# Patient Record
Sex: Female | Born: 1990 | Race: Black or African American | Hispanic: No | Marital: Single | Smoking: Never smoker
Health system: Southern US, Community
[De-identification: ages and names within clinical notes are randomized; demographics above are authoritative.]

## PROBLEM LIST (undated history)

## (undated) DIAGNOSIS — N390 Urinary tract infection, site not specified: Secondary | ICD-10-CM

## (undated) DIAGNOSIS — E209 Hypoparathyroidism, unspecified: Secondary | ICD-10-CM

---

## 2017-09-19 ENCOUNTER — Encounter (HOSPITAL_COMMUNITY): Payer: Self-pay | Admitting: *Deleted

## 2017-09-19 ENCOUNTER — Other Ambulatory Visit: Payer: Self-pay

## 2017-09-19 ENCOUNTER — Emergency Department (HOSPITAL_COMMUNITY)
Admission: EM | Admit: 2017-09-19 | Discharge: 2017-09-19 | Disposition: A | Discharge status: Home / Self Care | Payer: Self-pay | Attending: Emergency Medicine | Admitting: Emergency Medicine

## 2017-09-19 DIAGNOSIS — K0889 Other specified disorders of teeth and supporting structures: Secondary | ICD-10-CM | POA: Insufficient documentation

## 2017-09-19 MED ORDER — PENICILLIN V POTASSIUM 500 MG PO TABS: 500.0000 mg | ORAL_TABLET | Freq: Four times a day (QID) | ORAL | 0 refills | Status: AC

## 2017-09-19 MED ORDER — NAPROXEN 500 MG PO TABS: 500.0000 mg | ORAL_TABLET | Freq: Two times a day (BID) | ORAL | 0 refills | Status: DC

## 2017-09-19 MED ORDER — LIDOCAINE VISCOUS HCL 2 % MT SOLN: 15.0000 mL | OROMUCOSAL | 0 refills | Status: DC | PRN

## 2017-09-19 NOTE — ED Triage Notes (Signed)
Pt reports right upper dental pain for several days. Airway intact.

## 2017-09-19 NOTE — Discharge Instructions (Signed)
Please read and follow all provided instructions.  Your diagnoses today include:  1. Pain, dental     The exam and treatment you received today has been provided on an emergency basis only. This is not a substitute for complete medical or dental care. This problem will not resolve on its own without the care of a dentist.  Tests performed today include: Vital signs. See below for your results today.   Medications prescribed:   Take any prescribed medications only as directed. Use ibuprofen or naproxen for pain. Use the viscous lidocaine for mouth pain. Swish with the lidocaine and spit it out. Do not swallow it.  Please take all of your antibiotics until finished!   You may develop abdominal discomfort or diarrhea from the antibiotic.  You may help offset this with probiotics which you can buy or get in yogurt. Do not eat or take the probiotics until 2 hours after your antibiotic. Do not take your medicine if develop an itchy rash, swelling in your mouth or lips, or difficulty breathing.   Home care instructions:  Follow any educational materials contained in this packet.  Follow-up instructions: Please follow-up with your dentist for further evaluation of your symptoms.   Dental Assistance: See handout for dental referrals  Return instructions:  Please return to the Emergency Department if you experience worsening symptoms. Please return if you develop a fever, you develop more swelling in your face or neck, you have trouble breathing or swallowing food. Please return if you have any other emergent concerns.  Additional Information:  Your vital signs today were: BP 111/76 (BP Location: Right Arm)    Pulse 61    Temp 98.5 F (36.9 C) (Oral)    Resp 20    LMP 09/11/2017    SpO2 100%  If your blood pressure (BP) was elevated above 135/85 this visit, please have this repeated by your doctor within one month. --------------

## 2017-09-19 NOTE — ED Provider Notes (Signed)
[facility] EMERGENCY DEPARTMENT Provider Note   CSN: [id number] Arrival date & time: 09/19/17  1438     History   Chief Complaint Chief Complaint  Patient presents with  . Dental Pain    HPI Mary Monroe is a 27 y.o. female with no significant past medical history presents emergency department today for right upper dental pain.  Patient reports that she has been dealing with right upper dental pain for the last several months.  She initially had a filling placed but this fell out when she was chewing gum.  She reports that she recently moved down from [place] and does not have a dentist.  She is requesting resources so she can find one.  She reports over the last 3 days she has been having increasing pain in her right upper tooth.  She states this is worse with cold foods as well as chewing.  She is taken ibuprofen for symptoms with mild relief. Denies fever, chills, voice change, inability to control secretions, nausea/vomiting, facial swelling, dysphagia, odynophagia, drainage or trauma  HPI  History reviewed. No pertinent past medical history.  There are no active problems to display for this patient.   History reviewed. No pertinent surgical history.   OB History   None      Home Medications    Prior to Admission medications   Not on File    Family History History reviewed. No pertinent family history.  Social History Social History   Tobacco Use  . Smoking status: Never Smoker  Substance Use Topics  . Alcohol use: Yes    Comment: socially  . Drug use: Never     Allergies   Patient has no known allergies.   Review of Systems Review of Systems  All other systems reviewed and are negative.    Physical Exam Updated Vital Signs BP 111/76 (BP Location: Right Arm)   Pulse 61   Temp 98.5 F (36.9 C) (Oral)   Resp 20   LMP 09/11/2017   SpO2 100%   Physical Exam  Constitutional: She appears well-developed and well-nourished.    HENT:  Head: Normocephalic and atraumatic.  Right Ear: External ear normal.  Left Ear: External ear normal.  Nose: Nose normal.  Mouth/Throat: Uvula is midline, oropharynx is clear and moist and mucous membranes are normal. No tonsillar exudate.    The patient has normal phonation and is in control of secretions. No stridor.  Midline uvula without edema. Soft palate rises symmetrically. No tonsillar erythema or exudates. No PTA. Tongue protrusion is normal. No trismus. No creptius on neck palpation. There is noted cavity of tooth indicated in diagram. No gingival erythema or fluctuance noted. Mucus membranes moist.   Eyes: Pupils are equal, round, and reactive to light. Right Monroe exhibits no discharge. Left Monroe exhibits no discharge. No scleral icterus.  Neck: Trachea normal. Neck supple. No spinous process tenderness present. No neck rigidity. Normal range of motion present.  No nuchal rigidity or meningismus.  No submental or submandibular edema.  No crepitus on neck palpation.   Cardiovascular: Normal rate, regular rhythm and intact distal pulses.  No murmur heard. Pulses:      Radial pulses are 2+ on the right side, and 2+ on the left side.       Dorsalis pedis pulses are 2+ on the right side, and 2+ on the left side.       Posterior tibial pulses are 2+ on the right side, and 2+ on  the left side.  Pulmonary/Chest: Effort normal and breath sounds normal. She exhibits no tenderness.  Abdominal: Soft. Bowel sounds are normal. There is no tenderness. There is no rebound and no guarding.  Musculoskeletal: She exhibits no edema.  Lymphadenopathy:    She has no cervical adenopathy.  Neurological: She is alert.  Skin: Skin is warm and dry. No rash noted. She is not diaphoretic.  Psychiatric: She has a normal mood and affect.  Nursing note and vitals reviewed.    ED Treatments / Results  Labs (all labs ordered are listed, but only abnormal results are displayed) Labs Reviewed - No data  to display  EKG None  Radiology No results found.  Procedures Procedures (including critical care time)  Medications Ordered in ED Medications - No data to display   Initial Impression / Assessment and Plan / ED Course  I have reviewed the triage vital signs and the nursing notes.  Pertinent labs & imaging results that were available during my care of the patient were reviewed by me and considered in my medical decision making (see chart for details).     Patient with toothache.  No gross abscess.  Exam unconcerning for Ludwig's angina or spread of infection.  Will treat with penicillin and anti-inflammatories medicine.  Urged patient to follow-up with dentist. Return precautions discussed.   Final Clinical Impressions(s) / ED Diagnoses   Final diagnoses:  Pain, dental    ED Discharge Orders         Ordered    penicillin v potassium (VEETID) 500 MG tablet  4 times daily     09/19/17 1830    lidocaine (XYLOCAINE) 2 % solution  As needed     09/19/17 1830    naproxen (NAPROSYN) 500 MG tablet  2 times daily     09/19/17 1830           Princella Pellegrini 09/19/17 1830    Sabas Sous, MD 09/20/17 [phone]

## 2018-05-31 DIAGNOSIS — E559 Vitamin D deficiency, unspecified: Secondary | ICD-10-CM | POA: Insufficient documentation

## 2019-05-02 ENCOUNTER — Emergency Department (HOSPITAL_COMMUNITY): Payer: 59

## 2019-05-02 ENCOUNTER — Emergency Department (HOSPITAL_COMMUNITY)
Admission: EM | Admit: 2019-05-02 | Discharge: 2019-05-03 | Disposition: A | Discharge status: Home / Self Care | Payer: 59 | Attending: Emergency Medicine | Admitting: Emergency Medicine

## 2019-05-02 ENCOUNTER — Encounter (HOSPITAL_COMMUNITY): Payer: Self-pay | Admitting: *Deleted

## 2019-05-02 ENCOUNTER — Other Ambulatory Visit: Payer: Self-pay

## 2019-05-02 DIAGNOSIS — R102 Pelvic and perineal pain: Secondary | ICD-10-CM | POA: Insufficient documentation

## 2019-05-02 DIAGNOSIS — M545 Low back pain: Secondary | ICD-10-CM | POA: Diagnosis not present

## 2019-05-02 DIAGNOSIS — N938 Other specified abnormal uterine and vaginal bleeding: Secondary | ICD-10-CM

## 2019-05-02 DIAGNOSIS — N939 Abnormal uterine and vaginal bleeding, unspecified: Secondary | ICD-10-CM | POA: Diagnosis present

## 2019-05-02 LAB — CBC WITH DIFFERENTIAL/PLATELET
Abs Immature Granulocytes: 0.02 10*3/uL (ref 0.00–0.07)
Basophils Absolute: 0.1 10*3/uL (ref 0.0–0.1)
Basophils Relative: 1 %
Eosinophils Absolute: 0.1 10*3/uL (ref 0.0–0.5)
Eosinophils Relative: 2 %
HCT: 41.5 % (ref 36.0–46.0)
Hemoglobin: 13.1 g/dL (ref 12.0–15.0)
Immature Granulocytes: 0 %
Lymphocytes Relative: 43 %
Lymphs Abs: 3.2 10*3/uL (ref 0.7–4.0)
MCH: 26.6 pg (ref 26.0–34.0)
MCHC: 31.6 g/dL (ref 30.0–36.0)
MCV: 84.2 fL (ref 80.0–100.0)
Monocytes Absolute: 0.8 10*3/uL (ref 0.1–1.0)
Monocytes Relative: 11 %
Neutro Abs: 3.2 10*3/uL (ref 1.7–7.7)
Neutrophils Relative %: 43 %
Platelets: 308 10*3/uL (ref 150–400)
RBC: 4.93 MIL/uL (ref 3.87–5.11)
RDW: 15 % (ref 11.5–15.5)
WBC: 7.5 10*3/uL (ref 4.0–10.5)
nRBC: 0 % (ref 0.0–0.2)

## 2019-05-02 LAB — WET PREP, GENITAL
Sperm: NONE SEEN
Trich, Wet Prep: NONE SEEN
WBC, Wet Prep HPF POC: NONE SEEN
Yeast Wet Prep HPF POC: NONE SEEN

## 2019-05-02 LAB — BASIC METABOLIC PANEL
Anion gap: 9 (ref 5–15)
BUN: 9 mg/dL (ref 6–20)
CO2: 25 mmol/L (ref 22–32)
Calcium: 10.5 mg/dL — ABNORMAL HIGH (ref 8.9–10.3)
Chloride: 100 mmol/L (ref 98–111)
Creatinine, Ser: 0.82 mg/dL (ref 0.44–1.00)
GFR calc Af Amer: >60 mL/min (ref >60)
GFR calc non Af Amer: >60 mL/min (ref >60)
Glucose, Bld: 93 mg/dL (ref 70–99)
Potassium: 3.4 mmol/L — ABNORMAL LOW (ref 3.5–5.1)
Sodium: 134 mmol/L — ABNORMAL LOW (ref 135–145)

## 2019-05-02 LAB — I-STAT BETA HCG BLOOD, ED (MC, WL, AP ONLY): I-stat hCG, quantitative: <5 m[IU]/mL (ref <5)

## 2019-05-02 MED ORDER — ACETAMINOPHEN 500 MG PO TABS
1000.0000 mg | ORAL_TABLET | Freq: Once | ORAL | Status: AC
  Administered 2019-05-02: 23:00:00 1000 mg via ORAL
  Filled 2019-05-02: qty 2

## 2019-05-02 MED ORDER — ACETAMINOPHEN 325 MG PO TABS
650.0000 mg | ORAL_TABLET | Freq: Once | ORAL | Status: DC
  Filled 2019-05-02: qty 2

## 2019-05-02 NOTE — ED Notes (Signed)
Pt transported to US at this time. 

## 2019-05-02 NOTE — ED Triage Notes (Signed)
Pt says that she has had vaginal spotting for about 3 weeks, yesterday started having heavier bleeding with clots associated with abdominal cramping. Reports negative pregnancy test.

## 2019-05-02 NOTE — ED Provider Notes (Signed)
[facility] EMERGENCY DEPARTMENT Provider Note   CSN: [id number] Arrival date & time: 05/02/19  2021     History Chief Complaint  Patient presents with  . Vaginal Discharge    Mary Monroe is a 29 y.o. female G1P1 with no pertinent past medical history that presents to the emergency department today for vaginal bleeding.  She denies chance of pregnancy.  She states that her LMP was March 26 and ended March 30.  She has had vaginal spotting for the last two weeks, she states that she presented to the emergency department today because it was the worst today.   She states that she has been going through about 5 pads daily.  She states that this has never happened before, her periods have been normal without bleeding in between periods.  She states that she had her last child about 2 years ago without any major complications. She denies taking any oral contraceptives or estrogen hormone therapy.  She denies starting any other new medications.  She denies any chances of pregnancies or STDs.  She has 1 sexual partner. She also admits to bilateral lower quadrant abdominal/pelvic pain and back pain that started around the same time as her spotting.  She describes the abdominal pain as cramping and it is constant, she states that it is a 7/10.  She states that her back pain is in her lower back which just started this week.  She states that when she normally gets her periods she has cramps which feels similar, however this pain today is worse.  She has not taken anything for pain.  She denies any chest pain, shortness of breath, dizziness, headache, paresthesias, fatigue, weakness, trouble with gait, dysuria, nausea, vomiting, diarrhea, hematochezia, fevers, chills.  She states that she has not seen her OB/GYN for 2 years, unknown last Pap.  She states that she could have had an abnormal Pap in the past but is unsure.  In regards to her GYN history she states that she does have history of  ovarian cysts which have gone away.  She has no personal or family history of endometriosis or PCOS. HPI     History reviewed. No pertinent past medical history.  There are no problems to display for this patient.   History reviewed. No pertinent surgical history.   OB History   No obstetric history on file.     No family history on file.  Social History   Tobacco Use  . Smoking status: Never Smoker  Substance Use Topics  . Alcohol use: Yes    Comment: socially  . Drug use: Never    Home Medications Prior to Admission medications   Medication Sig Start Date End Date Taking? Authorizing Provider  lidocaine (XYLOCAINE) 2 % solution Use as directed 15 mLs in the mouth or throat as needed for mouth pain. Patient not taking: Reported on 05/02/2019 09/19/17   Maczis, Elmer Sow, PA-C  naproxen (NAPROSYN) 500 MG tablet Take 1 tablet (500 mg total) by mouth 2 (two) times daily. Patient not taking: Reported on 05/02/2019 09/19/17   Jacinto Halim, PA-C    Allergies    Patient has no known allergies.  Review of Systems   Review of Systems  Constitutional: Negative for chills, diaphoresis, fatigue and fever.  HENT: Negative.   Eyes: Negative.   Respiratory: Negative for cough, chest tightness and shortness of breath.   Cardiovascular: Negative for chest pain and palpitations.  Gastrointestinal: Positive for abdominal pain. Negative  for blood in stool, nausea, rectal pain and vomiting.  Genitourinary: Positive for menstrual problem, pelvic pain, vaginal bleeding and vaginal pain. Negative for dysuria, flank pain, hematuria and vaginal discharge.  Skin: Negative for pallor.  Neurological: Negative for dizziness, weakness and numbness.  Psychiatric/Behavioral: Negative.     Physical Exam Updated Vital Signs BP 128/75 (BP Location: Right Arm)   Pulse 85   Temp 98.6 F (37 C) (Oral)   Resp 18   LMP 04/05/2019   SpO2 100%   Physical Exam .PE: Constitutional:  well-developed, well-nourished, no apparent distress HENT: normocephalic, atraumatic Cardiovascular: normal rate and rhythm, distal pulses intact Pulmonary/Chest: effort normal; breath sounds clear and equal bilaterally; no wheezes or rales Abdominal: soft, nondistended.  Slight tenderness to palpation of generalized lower abdomen to pelvis.  No guarding.  No rebound tenderness.  No Murphy sign.  No McBurney's point tenderness. Musculoskeletal: full ROM, no edema, patient is tender towards lower back, no midline tenderness.  Is able to move back in all directions without pain. GYN: Chaperone present,.Pelvic exam shows normal external genitalia without any lesions, dried blood around introitus.  Multiple blood clots noticed in vaginal canal, no abnormal discharge.  Cervix is nonfriable, closed.  There is moderate amount of blood coming from the cervix.  Patient is extremely tender to speculum.  Patient is extremely tender to bimanual exam in both adnexa.  No true cervical motion tenderness. Neurological: alert with goal directed thinking Skin: warm and dry, no rash, no diaphoresis Psychiatric: normal mood and affect, normal behavior   ED Results / Procedures / Treatments   Labs (all labs ordered are listed, but only abnormal results are displayed) Labs Reviewed  WET PREP, GENITAL  CBC WITH DIFFERENTIAL/PLATELET  BASIC METABOLIC PANEL  URINALYSIS, ROUTINE W REFLEX MICROSCOPIC  I-STAT BETA HCG BLOOD, ED ([facility], [facility], [facility] ONLY)  GC/CHLAMYDIA PROBE AMP ([place]) NOT AT [facility]    EKG None  Radiology No results found.  Procedures Procedures (including critical care time)  Medications Ordered in ED Medications  acetaminophen (TYLENOL) tablet 650 mg (has no administration in time range)    ED Course  I have reviewed the triage vital signs and the nursing notes.  Pertinent labs & imaging results that were available during my care of the patient were reviewed by me and considered in my  medical decision making (see chart for details).    MDM Rules/Calculators/A&P    Keith Cancio is a 29 y.o. female G1P1 with no pertinent past medical history that presents to the emergency department today for vaginal bleeding.  Patient is not pregnant.  Patient is extremely tender on pelvic exam.Differential to include uterine fibroid, ovarian cyst rupture, ectopic pregnancy, TOA, endometriosis, adenomyosis.  Unlikely to be ovarian rupture due to prolonged time.  I ordered, reviewed and interpreted labs which included stable CBC and CMP.  Wet prep showed clue cells, symptoms and pelvic exam not suggestive of BV.  Gonorrhea chlamydia collected.  Beta hCG negative. I ordered imaging studies to include pelvic ultrasound which has not been completed.   At shift change, care given to [employer] who will follow [place] and make disposition based on that. Pain controlled with Tylenol.   I discussed this case with my attending physician,Dr. Sabra Heck, who cosigned this note including patient's presenting symptoms, physical exam, and planned diagnostics and interventions. Attending physician stated agreement with plan or made changes to plan which were implemented.     Final Clinical Impression(s) / ED Diagnoses Final diagnoses:  None    Rx / DC Orders ED Discharge Orders    None       Farrel Gordon, Cordelia Poche 05/02/19 Ouida Sills    Eber Hong, MD 05/04/19 [phone]

## 2019-05-02 NOTE — ED Notes (Signed)
Pelvic cart is at bedside 

## 2019-05-03 LAB — URINALYSIS, ROUTINE W REFLEX MICROSCOPIC
Bilirubin Urine: NEGATIVE
Glucose, UA: NEGATIVE mg/dL
Ketones, ur: NEGATIVE mg/dL
Leukocytes,Ua: NEGATIVE
Nitrite: NEGATIVE
Protein, ur: 30 mg/dL — AB
RBC / HPF: >50 RBC/hpf — ABNORMAL HIGH (ref 0–5)
Specific Gravity, Urine: 1.02 (ref 1.005–1.030)
pH: 7 (ref 5.0–8.0)

## 2019-05-03 LAB — GC/CHLAMYDIA PROBE AMP (~~LOC~~) NOT AT ARMC
Chlamydia: NEGATIVE
Comment: NEGATIVE
Comment: NORMAL
Neisseria Gonorrhea: NEGATIVE

## 2019-05-03 NOTE — ED Notes (Signed)
Patient verbalizes understanding of discharge instructions. Opportunity for questioning and answers were provided. Armband removed by staff, pt discharged from ED ambulatory to home.  

## 2019-05-03 NOTE — Discharge Instructions (Addendum)
Please contact your OBGYN tomorrow.  You will need to make a follow-up appointment with them.  Tonight, your blood counts and ultrasound are reassuring.  Continue to drink plenty of fluids.  Return to the ER for fever, syncope, or for any other symptoms that concern you.

## 2019-05-03 NOTE — ED Provider Notes (Signed)
Patient signed out to me at shift change.  Here for vaginal bleeding x 3 weeks with some cramping.    HCG negative.  HGB stable, no anemia.  VSS.  Non-toxic appearing.  [place] of pelvis is normal.   Recommend outpatient f/u with OBGYN for abnormal uterine bleeding.  Patient understands and agrees with the plan.   Roxy Horseman, PA-C 05/03/19 0009    Eber Hong, MD 05/04/19 [phone]

## 2019-05-04 LAB — URINE CULTURE: Culture: NO GROWTH

## 2020-03-09 ENCOUNTER — Other Ambulatory Visit: Payer: Self-pay

## 2020-03-09 ENCOUNTER — Encounter (HOSPITAL_COMMUNITY): Payer: Self-pay | Admitting: Emergency Medicine

## 2020-03-09 ENCOUNTER — Emergency Department (HOSPITAL_COMMUNITY)
Admission: EM | Admit: 2020-03-09 | Discharge: 2020-03-09 | Disposition: A | Discharge status: Home / Self Care | Payer: 59 | Attending: Emergency Medicine | Admitting: Emergency Medicine

## 2020-03-09 DIAGNOSIS — R300 Dysuria: Secondary | ICD-10-CM | POA: Insufficient documentation

## 2020-03-09 DIAGNOSIS — R102 Pelvic and perineal pain: Secondary | ICD-10-CM | POA: Insufficient documentation

## 2020-03-09 DIAGNOSIS — Z793 Long term (current) use of hormonal contraceptives: Secondary | ICD-10-CM | POA: Insufficient documentation

## 2020-03-09 DIAGNOSIS — N898 Other specified noninflammatory disorders of vagina: Secondary | ICD-10-CM

## 2020-03-09 HISTORY — DX: Urinary tract infection, site not specified: N39.0

## 2020-03-09 LAB — WET PREP, GENITAL
Clue Cells Wet Prep HPF POC: NONE SEEN
Sperm: NONE SEEN
Trich, Wet Prep: NONE SEEN
Yeast Wet Prep HPF POC: NONE SEEN

## 2020-03-09 LAB — URINALYSIS, ROUTINE W REFLEX MICROSCOPIC
Bilirubin Urine: NEGATIVE
Glucose, UA: NEGATIVE mg/dL
Hgb urine dipstick: NEGATIVE
Ketones, ur: 20 mg/dL — AB
Leukocytes,Ua: NEGATIVE
Nitrite: NEGATIVE
Protein, ur: NEGATIVE mg/dL
Specific Gravity, Urine: 1.029 (ref 1.005–1.030)
pH: 5 (ref 5.0–8.0)

## 2020-03-09 LAB — CBC WITH DIFFERENTIAL/PLATELET
Abs Immature Granulocytes: 0 10*3/uL (ref 0.00–0.07)
Basophils Absolute: 0 10*3/uL (ref 0.0–0.1)
Basophils Relative: 1 %
Eosinophils Absolute: 0.1 10*3/uL (ref 0.0–0.5)
Eosinophils Relative: 1 %
HCT: 37.1 % (ref 36.0–46.0)
Hemoglobin: 11.8 g/dL — ABNORMAL LOW (ref 12.0–15.0)
Immature Granulocytes: 0 %
Lymphocytes Relative: 43 %
Lymphs Abs: 2.3 10*3/uL (ref 0.7–4.0)
MCH: 27 pg (ref 26.0–34.0)
MCHC: 31.8 g/dL (ref 30.0–36.0)
MCV: 84.9 fL (ref 80.0–100.0)
Monocytes Absolute: 0.6 10*3/uL (ref 0.1–1.0)
Monocytes Relative: 10 %
Neutro Abs: 2.5 10*3/uL (ref 1.7–7.7)
Neutrophils Relative %: 45 %
Platelets: 307 10*3/uL (ref 150–400)
RBC: 4.37 MIL/uL (ref 3.87–5.11)
RDW: 15.7 % — ABNORMAL HIGH (ref 11.5–15.5)
WBC: 5.4 10*3/uL (ref 4.0–10.5)
nRBC: 0 % (ref 0.0–0.2)

## 2020-03-09 LAB — BASIC METABOLIC PANEL
Anion gap: 11 (ref 5–15)
BUN: 7 mg/dL (ref 6–20)
CO2: 20 mmol/L — ABNORMAL LOW (ref 22–32)
Calcium: 9.9 mg/dL (ref 8.9–10.3)
Chloride: 107 mmol/L (ref 98–111)
Creatinine, Ser: 0.64 mg/dL (ref 0.44–1.00)
GFR, Estimated: >60 mL/min (ref >60)
Glucose, Bld: 86 mg/dL (ref 70–99)
Potassium: 3.6 mmol/L (ref 3.5–5.1)
Sodium: 138 mmol/L (ref 135–145)

## 2020-03-09 LAB — I-STAT BETA HCG BLOOD, ED (MC, WL, AP ONLY): I-stat hCG, quantitative: <5 m[IU]/mL (ref <5)

## 2020-03-09 NOTE — ED Provider Notes (Signed)
[facility] EMERGENCY DEPARTMENT Provider Note   CSN: [id number] Arrival date & time: 03/09/20  1436     History Chief Complaint  Patient presents with   Possible Allergic Reaction    Mary Monroe is a 30 y.o. female.  Patient to ED with symptoms of vaginal pain described as external or vulvar irritation, as well as dysuria. Symptoms started one week ago almost immediately after inserting a new Nuvaring. She denies fever, vaginal discharge, abnormal bleeding, blistering or genital sore. She also reports that at the same time as symptom onset, she started using a new kind of condom out of concern for having developed a latex allergy.   The history is provided by the patient. No language interpreter was used.       Past Medical History:  Diagnosis Date   Urinary tract infection     There are no problems to display for this patient.   No past surgical history on file.   OB History   No obstetric history on file.     No family history on file.  Social History   Tobacco Use   Smoking status: Never Smoker  Substance Use Topics   Alcohol use: Yes    Comment: socially   Drug use: Never    Home Medications Prior to Admission medications   Medication Sig Start Date End Date Taking? Authorizing Provider  lidocaine (XYLOCAINE) 2 % solution Use as directed 15 mLs in the mouth or throat as needed for mouth pain. Patient not taking: Reported on 05/02/2019 09/19/17   Maczis, Elmer Sow, PA-C  naproxen (NAPROSYN) 500 MG tablet Take 1 tablet (500 mg total) by mouth 2 (two) times daily. Patient not taking: Reported on 05/02/2019 09/19/17   Jacinto Halim, PA-C    Allergies    Patient has no known allergies.  Review of Systems   Review of Systems  Constitutional: Negative for chills and fever.  Gastrointestinal: Negative for abdominal pain and nausea.  Genitourinary: Positive for dysuria and vaginal pain. Negative for hematuria, pelvic pain, vaginal  bleeding and vaginal discharge.    Physical Exam Updated Vital Signs BP 138/76 (BP Location: Left Arm)    Pulse 64    Temp 99.1 F (37.3 C) (Oral)    Resp 16    SpO2 100%   Physical Exam Vitals and nursing note reviewed.  Constitutional:      Appearance: She is well-developed and well-nourished.  Pulmonary:     Effort: Pulmonary effort is normal.  Abdominal:     Palpations: Abdomen is soft.     Tenderness: There is no abdominal tenderness.  Genitourinary:    Comments: External vagina and vulva unremarkable. No lesions, redness, or swelling. There is a white discharge present in the vaginal vault. No cervical discharge, friability or tenderness to movement. No adnexal tenderness or fullness.  Musculoskeletal:        General: Normal range of motion.     Cervical back: Normal range of motion.  Skin:    General: Skin is warm and dry.     Findings: No rash.  Neurological:     Mental Status: She is alert and oriented to person, place, and time.     ED Results / Procedures / Treatments   Labs (all labs ordered are listed, but only abnormal results are displayed) Labs Reviewed  URINALYSIS, ROUTINE W REFLEX MICROSCOPIC - Abnormal; Notable for the following components:      Result Value   APPearance CLOUDY (*)  Ketones, ur 20 (*)    All other components within normal limits  CBC WITH DIFFERENTIAL/PLATELET - Abnormal; Notable for the following components:   Hemoglobin 11.8 (*)    RDW 15.7 (*)    All other components within normal limits  BASIC METABOLIC PANEL - Abnormal; Notable for the following components:   CO2 20 (*)    All other components within normal limits  WET PREP, GENITAL  I-STAT BETA HCG BLOOD, ED ([facility], [facility], [facility] ONLY)  GC/CHLAMYDIA PROBE AMP ([place]) NOT AT [facility]    EKG None  Radiology No results found.  Procedures Procedures   Medications Ordered in ED Medications - No data to display  ED Course  I have reviewed the triage vital signs and the  nursing notes.  Pertinent labs & imaging results that were available during my care of the patient were reviewed by me and considered in my medical decision making (see chart for details).    MDM Rules/Calculators/A&P                          Patient to ED with vaginal irritation causing concern for possible allergic reaction from new Nuvaring. She also relates ruptured condom one week ago when symptoms started. No fever, abdominal pain.   Vaginal exam reassuring. Doubt STD given no CMT, concerning discharge or pelvic tenderness. Cultures pending. No BV or trich on wet prep. No evidence of allergic reaction or significant vaginal or vulvar irritation.   Patient reassured. Recommend GYN follow up if symptoms persist.   Final Clinical Impression(s) / ED Diagnoses Final diagnoses:  None   1. Vaginal irritation   Rx / DC Orders ED Discharge Orders    None       Elpidio Anis, Cordelia Poche 03/11/20 1749    Mancel Bale, MD 03/11/20 1504

## 2020-03-09 NOTE — Discharge Instructions (Addendum)
Your exam and labs are normal. There is no evidence of infection, injury or allergic type reaction.   Follow up with your doctor for recheck if symptoms persist.

## 2020-03-09 NOTE — ED Triage Notes (Signed)
Patient from home. Complaint of possible reaction to birth control. Patient states use of nuva ring and used 1 week ago. Patient states since then has had tingling feeling . A&Ox4. NAD.

## 2020-03-10 LAB — GC/CHLAMYDIA PROBE AMP (~~LOC~~) NOT AT ARMC
Chlamydia: NEGATIVE
Comment: NEGATIVE
Comment: NORMAL
Neisseria Gonorrhea: NEGATIVE

## 2020-04-23 ENCOUNTER — Emergency Department (HOSPITAL_COMMUNITY)
Admission: EM | Admit: 2020-04-23 | Discharge: 2020-04-24 | Disposition: A | Discharge status: Home / Self Care | Payer: 59 | Attending: Emergency Medicine | Admitting: Emergency Medicine

## 2020-04-23 ENCOUNTER — Emergency Department (HOSPITAL_COMMUNITY): Payer: 59

## 2020-04-23 ENCOUNTER — Other Ambulatory Visit: Payer: Self-pay

## 2020-04-23 ENCOUNTER — Encounter (HOSPITAL_COMMUNITY): Payer: Self-pay

## 2020-04-23 DIAGNOSIS — Y92410 Unspecified street and highway as the place of occurrence of the external cause: Secondary | ICD-10-CM | POA: Insufficient documentation

## 2020-04-23 DIAGNOSIS — M7918 Myalgia, other site: Secondary | ICD-10-CM

## 2020-04-23 DIAGNOSIS — R0789 Other chest pain: Secondary | ICD-10-CM | POA: Diagnosis not present

## 2020-04-23 DIAGNOSIS — R079 Chest pain, unspecified: Secondary | ICD-10-CM | POA: Diagnosis present

## 2020-04-23 MED ORDER — IBUPROFEN 400 MG PO TABS
600.0000 mg | ORAL_TABLET | Freq: Once | ORAL | Status: AC
  Administered 2020-04-23: 600 mg via ORAL
  Filled 2020-04-23: qty 1
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
## 2020-04-24 MED ORDER — IBUPROFEN 600 MG PO TABS: 600.0000 mg | ORAL_TABLET | Freq: Four times a day (QID) | ORAL | 0 refills | Status: DC | PRN

## 2020-04-24 MED ORDER — CYCLOBENZAPRINE HCL 10 MG PO TABS: 10.0000 mg | ORAL_TABLET | Freq: Two times a day (BID) | ORAL | 0 refills | Status: DC | PRN

## 2020-04-24 MED ORDER — HYDROCODONE-ACETAMINOPHEN 5-325 MG PO TABS
1.0000 | ORAL_TABLET | Freq: Once | ORAL | Status: AC
  Administered 2020-04-24: 1 via ORAL
  Filled 2020-04-24: qty 1

## 2020-04-24 MED ORDER — HYDROCODONE-ACETAMINOPHEN 5-325 MG PO TABS: 1.0000 | ORAL_TABLET | ORAL | 0 refills | Status: DC | PRN

## 2020-04-24 MED ORDER — CYCLOBENZAPRINE HCL 10 MG PO TABS
10.0000 mg | ORAL_TABLET | Freq: Once | ORAL | Status: AC
  Administered 2020-04-24: 10 mg via ORAL
  Filled 2020-04-24: qty 1

## 2020-04-24 NOTE — Discharge Instructions (Signed)
Take medications as prescribed. Warm compresses per above instructions.   Return to the ED with any new or worsening symptoms at any time.

## 2020-05-04 ENCOUNTER — Emergency Department (HOSPITAL_COMMUNITY)
Admission: EM | Admit: 2020-05-04 | Discharge: 2020-05-05 | Discharge status: Against Medical Advice | Payer: 59 | Attending: Emergency Medicine | Admitting: Emergency Medicine

## 2020-05-04 ENCOUNTER — Emergency Department (HOSPITAL_COMMUNITY): Payer: 59

## 2020-05-04 DIAGNOSIS — Z20822 Contact with and (suspected) exposure to covid-19: Secondary | ICD-10-CM | POA: Diagnosis not present

## 2020-05-04 DIAGNOSIS — R0981 Nasal congestion: Secondary | ICD-10-CM | POA: Diagnosis not present

## 2020-05-04 DIAGNOSIS — Y92410 Unspecified street and highway as the place of occurrence of the external cause: Secondary | ICD-10-CM | POA: Insufficient documentation

## 2020-05-04 DIAGNOSIS — Z28310 Unvaccinated for covid-19: Secondary | ICD-10-CM | POA: Diagnosis not present

## 2020-05-04 DIAGNOSIS — M549 Dorsalgia, unspecified: Secondary | ICD-10-CM | POA: Insufficient documentation

## 2020-05-04 DIAGNOSIS — R0789 Other chest pain: Secondary | ICD-10-CM | POA: Diagnosis present

## 2020-05-04 DIAGNOSIS — Z5321 Procedure and treatment not carried out due to patient leaving prior to being seen by health care provider: Secondary | ICD-10-CM | POA: Diagnosis not present
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
## 2020-05-04 NOTE — ED Notes (Signed)
Pt left due to not being seen quick enough 
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
## 2020-05-05 LAB — SARS CORONAVIRUS 2 (TAT 6-24 HRS): SARS Coronavirus 2: NEGATIVE

## 2020-05-20 ENCOUNTER — Ambulatory Visit (HOSPITAL_COMMUNITY): Payer: Self-pay

## 2021-01-20 ENCOUNTER — Other Ambulatory Visit: Payer: Self-pay

## 2021-01-20 ENCOUNTER — Emergency Department (HOSPITAL_COMMUNITY)
Admission: EM | Admit: 2021-01-20 | Discharge: 2021-01-20 | Disposition: A | Discharge status: Home / Self Care | Payer: 59 | Attending: Emergency Medicine | Admitting: Emergency Medicine

## 2021-01-20 DIAGNOSIS — R509 Fever, unspecified: Secondary | ICD-10-CM

## 2021-01-20 DIAGNOSIS — G44201 Tension-type headache, unspecified, intractable: Secondary | ICD-10-CM | POA: Diagnosis not present

## 2021-01-20 DIAGNOSIS — N3001 Acute cystitis with hematuria: Secondary | ICD-10-CM | POA: Diagnosis not present

## 2021-01-20 DIAGNOSIS — Z20822 Contact with and (suspected) exposure to covid-19: Secondary | ICD-10-CM | POA: Diagnosis not present

## 2021-01-20 LAB — URINALYSIS, ROUTINE W REFLEX MICROSCOPIC
Bilirubin Urine: NEGATIVE
Glucose, UA: NEGATIVE mg/dL
Ketones, ur: >80 mg/dL — AB
Nitrite: POSITIVE — AB
Protein, ur: 100 mg/dL — AB
Specific Gravity, Urine: >1.03 — ABNORMAL HIGH (ref 1.005–1.030)
pH: 6 (ref 5.0–8.0)

## 2021-01-20 LAB — URINALYSIS, MICROSCOPIC (REFLEX): WBC, UA: >50 WBC/hpf (ref 0–5)

## 2021-01-20 LAB — RESP PANEL BY RT-PCR (FLU A&B, COVID) ARPGX2
Influenza A by PCR: NEGATIVE
Influenza B by PCR: NEGATIVE
SARS Coronavirus 2 by RT PCR: NEGATIVE

## 2021-01-20 LAB — GROUP A STREP BY PCR: Group A Strep by PCR: NOT DETECTED

## 2021-01-20 LAB — PREGNANCY, URINE: Preg Test, Ur: NEGATIVE

## 2021-01-20 MED ORDER — IBUPROFEN 400 MG PO TABS
600.0000 mg | ORAL_TABLET | Freq: Once | ORAL | Status: AC
  Administered 2021-01-20: 600 mg via ORAL
  Filled 2021-01-20: qty 1

## 2021-01-20 MED ORDER — ACETAMINOPHEN 500 MG PO TABS: 1000.0000 mg | ORAL_TABLET | Freq: Once | ORAL | Status: DC

## 2021-01-20 MED ORDER — ACETAMINOPHEN 500 MG PO TABS
1000.0000 mg | ORAL_TABLET | Freq: Once | ORAL | Status: AC
  Administered 2021-01-20: 1000 mg via ORAL
  Filled 2021-01-20: qty 2

## 2021-01-20 MED ORDER — LIDOCAINE HCL (PF) 1 % IJ SOLN
INTRAMUSCULAR | Status: AC
  Administered 2021-01-20: 1 mL
  Filled 2021-01-20: qty 5

## 2021-01-20 MED ORDER — CEFTRIAXONE SODIUM 250 MG IJ SOLR
1000.0000 mg | Freq: Once | INTRAMUSCULAR | Status: DC
  Filled 2021-01-20: qty 1000

## 2021-01-20 MED ORDER — CEFTRIAXONE SODIUM 1 G IJ SOLR
1.0000 g | Freq: Once | INTRAMUSCULAR | Status: AC
  Administered 2021-01-20: 1 g via INTRAMUSCULAR
  Filled 2021-01-20: qty 10

## 2021-01-20 MED ORDER — CEPHALEXIN 500 MG PO CAPS: 500.0000 mg | ORAL_CAPSULE | Freq: Three times a day (TID) | ORAL | 0 refills | Status: DC

## 2021-01-20 MED ORDER — LIDOCAINE HCL (PF) 1 % IJ SOLN: 1.0000 mL | Freq: Once | INTRAMUSCULAR | Status: AC

## 2021-01-20 NOTE — ED Triage Notes (Signed)
Pt here for eval of generalized body aches, headache, chest wall pain, and dry mouth x 3 days. Taking Tylenol without relief. Denies sore throat, nasal congestion, or other infectious symptoms.

## 2021-01-20 NOTE — ED Notes (Signed)
AVS with prescriptions provided to and discussed with patient. Pt verbalizes understanding of discharge instructions and denies any questions or concerns at this time. Pt ambulated out of department independently with steady gait. ? ?

## 2021-01-20 NOTE — Discharge Instructions (Addendum)
Stay well-hydrated and start taking your antibiotics tomorrow with breakfast, for 1 week. Return for uncontrolled pain, persistent vomiting or new concerns. Take Tylenol every 4 hours and ibuprofen every 6 hours as needed for pain or fevers.

## 2021-01-20 NOTE — ED Provider Notes (Signed)
[facility] EMERGENCY DEPARTMENT Provider Note   CSN: [id number] Arrival date & time: 01/20/21  1012     History  Chief Complaint  Patient presents with   Generalized Body Aches    Mary Monroe is a 31 y.o. female.  Patient presents with body aches, headache decreased appetite for the past 2 to 3 days.  Patient denies any cough, sore throat or fever.  No shortness of breath.  Patient has a history of UTI and has noticed change in odor to her urine.  No vaginal symptoms.  No abdominal pain or back pain.  Patient denies neurologic symptoms, frontal headache.  No significant sick contacts.      Home Medications Prior to Admission medications   Medication Sig Start Date End Date Taking? Authorizing Provider  cephALEXin (KEFLEX) 500 MG capsule Take 1 capsule (500 mg total) by mouth 3 (three) times daily. 01/20/21  Yes Elnora Morrison, MD  cyclobenzaprine (FLEXERIL) 10 MG tablet Take 1 tablet (10 mg total) by mouth 2 (two) times daily as needed for muscle spasms. 04/24/20   Charlann Lange, PA-C  HYDROcodone-acetaminophen (NORCO/VICODIN) 5-325 MG tablet Take 1 tablet by mouth every 4 (four) hours as needed for severe pain. 04/24/20   Charlann Lange, PA-C  ibuprofen (ADVIL) 600 MG tablet Take 1 tablet (600 mg total) by mouth every 6 (six) hours as needed. 04/24/20   Charlann Lange, PA-C  lidocaine (XYLOCAINE) 2 % solution Use as directed 15 mLs in the mouth or throat as needed for mouth pain. Patient not taking: Reported on 05/02/2019 09/19/17   Maczis, Barth Kirks, PA-C  naproxen (NAPROSYN) 500 MG tablet Take 1 tablet (500 mg total) by mouth 2 (two) times daily. Patient not taking: Reported on 05/02/2019 09/19/17   Jillyn Ledger, PA-C      Allergies    Patient has no known allergies.    Review of Systems   Review of Systems  Constitutional:  Positive for appetite change and fever. Negative for chills.  HENT:  Negative for congestion.   Eyes:  Negative for visual  disturbance.  Respiratory:  Negative for shortness of breath.   Cardiovascular:  Negative for chest pain.  Gastrointestinal:  Negative for abdominal pain and vomiting.  Genitourinary:  Negative for dysuria and flank pain.  Musculoskeletal:  Negative for back pain, neck pain and neck stiffness.  Skin:  Negative for rash.  Neurological:  Positive for headaches. Negative for light-headedness.   Physical Exam Updated Vital Signs BP 114/71 (BP Location: Right Arm)    Pulse 96    Temp 99.1 F (37.3 C) (Oral)    Resp 14    LMP 01/06/2021 (Approximate)    SpO2 99%  Physical Exam Vitals and nursing note reviewed.  Constitutional:      General: She is not in acute distress.    Appearance: She is well-developed.  HENT:     Head: Normocephalic and atraumatic.     Mouth/Throat:     Mouth: Mucous membranes are dry.  Eyes:     General:        Right eye: No discharge.        Left eye: No discharge.     Conjunctiva/sclera: Conjunctivae normal.  Neck:     Trachea: No tracheal deviation.  Cardiovascular:     Rate and Rhythm: Normal rate and regular rhythm.     Heart sounds: No murmur heard. Pulmonary:     Effort: Pulmonary effort is normal.     Breath  sounds: Normal breath sounds.  Abdominal:     General: There is no distension.     Palpations: Abdomen is soft.     Tenderness: There is no abdominal tenderness. There is no guarding.  Musculoskeletal:        General: No swelling.     Cervical back: Normal range of motion and neck supple. No rigidity.  Skin:    General: Skin is warm.     Capillary Refill: Capillary refill takes less than 2 seconds.     Findings: No rash.  Neurological:     General: No focal deficit present.     Mental Status: She is alert.     Cranial Nerves: No cranial nerve deficit.     Motor: No weakness.  Psychiatric:     Comments: Patient generally does not feel well    ED Results / Procedures / Treatments   Labs (all labs ordered are listed, but only abnormal  results are displayed) Labs Reviewed  URINALYSIS, ROUTINE W REFLEX MICROSCOPIC - Abnormal; Notable for the following components:      Result Value   APPearance HAZY (*)    Specific Gravity, Urine >1.030 (*)    Hgb urine dipstick SMALL (*)    Ketones, ur >80 (*)    Protein, ur 100 (*)    Nitrite POSITIVE (*)    Leukocytes,Ua MODERATE (*)    All other components within normal limits  URINALYSIS, MICROSCOPIC (REFLEX) - Abnormal; Notable for the following components:   Bacteria, UA MANY (*)    All other components within normal limits  RESP PANEL BY RT-PCR (FLU A&B, COVID) ARPGX2  GROUP A STREP BY PCR  PREGNANCY, URINE    EKG None  Radiology No results found.  Procedures Procedures    Medications Ordered in ED Medications  cefTRIAXone (ROCEPHIN) injection 1,000 mg (has no administration in time range)  acetaminophen (TYLENOL) tablet 1,000 mg (1,000 mg Oral Given 01/20/21 1026)  ibuprofen (ADVIL) tablet 600 mg (600 mg Oral Given 01/20/21 1402)    ED Course/ Medical Decision Making/ A&P                           Medical Decision Making  Patient presents with body aches, low-grade fever and generally not feeling well differential includes influenza/other viral process/COVID, strep pharyngitis less likely without sore throat however he does have fever and headache, urine infection/early pyelonephritis given change in odor and fever, other.  No signs of meningitis or other significant bacterial infections.  Viral testing ordered and reviewed showing negative COVID-negative influenza, Tylenol and ibuprofen given to help improve headache.  Urine reviewed showing signs of dehydration with ketones and infection with nitrate positive white blood cells, recommended antibiotics.  Shared decision making for IV antibiotics with IV fluids versus oral fluids and intramuscular patient prefers intramuscular at this time.  Reasons to return discussed. Patient tolerating oral liquids in the ER,  young and healthy otherwise, no indication for admission at this time.        Final Clinical Impression(s) / ED Diagnoses Final diagnoses:  Acute cystitis with hematuria  Fever in adult  Acute intractable tension-type headache    Rx / DC Orders ED Discharge Orders          Ordered    cephALEXin (KEFLEX) 500 MG capsule  3 times daily        01/20/21 1445  Elnora Morrison, MD 01/20/21 [phone]

## 2021-01-22 ENCOUNTER — Other Ambulatory Visit: Payer: Self-pay

## 2021-01-22 ENCOUNTER — Emergency Department (HOSPITAL_COMMUNITY)
Admission: EM | Admit: 2021-01-22 | Discharge: 2021-01-22 | Disposition: A | Discharge status: Home / Self Care | Payer: 59 | Attending: Emergency Medicine | Admitting: Emergency Medicine

## 2021-01-22 ENCOUNTER — Emergency Department (HOSPITAL_COMMUNITY): Payer: 59

## 2021-01-22 DIAGNOSIS — N390 Urinary tract infection, site not specified: Secondary | ICD-10-CM | POA: Diagnosis not present

## 2021-01-22 DIAGNOSIS — R519 Headache, unspecified: Secondary | ICD-10-CM | POA: Diagnosis present

## 2021-01-22 DIAGNOSIS — G43809 Other migraine, not intractable, without status migrainosus: Secondary | ICD-10-CM | POA: Insufficient documentation

## 2021-01-22 MED ORDER — SODIUM CHLORIDE 0.9 % IV BOLUS
1000.0000 mL | Freq: Once | INTRAVENOUS | Status: AC
  Administered 2021-01-22: 1000 mL via INTRAVENOUS

## 2021-01-22 MED ORDER — SODIUM CHLORIDE 0.9 % IV SOLN
2.0000 g | Freq: Once | INTRAVENOUS | Status: AC
  Administered 2021-01-22: 2 g via INTRAVENOUS
  Filled 2021-01-22: qty 20

## 2021-01-22 MED ORDER — KETOROLAC TROMETHAMINE 15 MG/ML IJ SOLN
15.0000 mg | Freq: Once | INTRAMUSCULAR | Status: AC
  Administered 2021-01-22: 15 mg via INTRAVENOUS
  Filled 2021-01-22: qty 1

## 2021-01-22 MED ORDER — MAGNESIUM SULFATE 2 GM/50ML IV SOLN
2.0000 g | Freq: Once | INTRAVENOUS | Status: AC
  Administered 2021-01-22: 2 g via INTRAVENOUS
  Filled 2021-01-22: qty 50

## 2021-01-22 MED ORDER — CEPHALEXIN 500 MG PO CAPS: 500.0000 mg | ORAL_CAPSULE | Freq: Three times a day (TID) | ORAL | 0 refills | Status: DC

## 2021-01-22 MED ORDER — HYDROCODONE-ACETAMINOPHEN 5-325 MG PO TABS: 1.0000 | ORAL_TABLET | Freq: Four times a day (QID) | ORAL | 0 refills | Status: DC | PRN

## 2021-01-22 MED ORDER — DIPHENHYDRAMINE HCL 50 MG/ML IJ SOLN
12.5000 mg | Freq: Once | INTRAMUSCULAR | Status: AC
  Administered 2021-01-22: 12.5 mg via INTRAVENOUS
  Filled 2021-01-22: qty 1

## 2021-01-22 MED ORDER — METOCLOPRAMIDE HCL 5 MG/ML IJ SOLN
10.0000 mg | Freq: Once | INTRAMUSCULAR | Status: AC
  Administered 2021-01-22: 10 mg via INTRAVENOUS
  Filled 2021-01-22: qty 2

## 2021-01-22 MED ORDER — IBUPROFEN 600 MG PO TABS: 600.0000 mg | ORAL_TABLET | Freq: Four times a day (QID) | ORAL | 0 refills | Status: DC | PRN

## 2021-01-22 MED ORDER — IBUPROFEN 400 MG PO TABS
600.0000 mg | ORAL_TABLET | Freq: Once | ORAL | Status: AC
  Administered 2021-01-22: 600 mg via ORAL
  Filled 2021-01-22: qty 1

## 2021-01-22 NOTE — ED Provider Notes (Signed)
[facility] EMERGENCY DEPARTMENT Provider Note   CSN: [id number] Arrival date & time: 01/22/21  0144     History  Chief Complaint  Patient presents with   Migraine    Mary Monroe is a 31 y.o. female.  Pt is a 31 yo female presenting for  headache. Hx of recently dx UTI. States she got an antibiotic shot in ED but has not yet started antibiotic prescription. Pt states she developed headache and myalgias three days ago associated with fever of 100.61F orally and chills. No neurovascular deficits. Denies cough, sore throat, nasal congestion, nausea, vomiting, or diarrhea. Denies neck pain or neck stiffness.   The history is provided by the patient. No language interpreter was used.  Migraine Associated symptoms include headaches. Pertinent negatives include no chest pain, no abdominal pain and no shortness of breath.      Home Medications Prior to Admission medications   Medication Sig Start Date End Date Taking? Authorizing Provider  cephALEXin (KEFLEX) 500 MG capsule Take 1 capsule (500 mg total) by mouth 3 (three) times daily. 01/20/21   Elnora Morrison, MD  cyclobenzaprine (FLEXERIL) 10 MG tablet Take 1 tablet (10 mg total) by mouth 2 (two) times daily as needed for muscle spasms. 04/24/20   Charlann Lange, PA-C  HYDROcodone-acetaminophen (NORCO/VICODIN) 5-325 MG tablet Take 1 tablet by mouth every 4 (four) hours as needed for severe pain. 04/24/20   Charlann Lange, PA-C  ibuprofen (ADVIL) 600 MG tablet Take 1 tablet (600 mg total) by mouth every 6 (six) hours as needed. 04/24/20   Charlann Lange, PA-C  lidocaine (XYLOCAINE) 2 % solution Use as directed 15 mLs in the mouth or throat as needed for mouth pain. Patient not taking: Reported on 05/02/2019 09/19/17   Maczis, Barth Kirks, PA-C  naproxen (NAPROSYN) 500 MG tablet Take 1 tablet (500 mg total) by mouth 2 (two) times daily. Patient not taking: Reported on 05/02/2019 09/19/17   Jillyn Ledger, PA-C       Allergies    Patient has no known allergies.    Review of Systems   Review of Systems  Constitutional:  Positive for chills and fever.  HENT:  Negative for ear pain and sore throat.   Eyes:  Negative for pain and visual disturbance.  Respiratory:  Negative for cough and shortness of breath.   Cardiovascular:  Negative for chest pain and palpitations.  Gastrointestinal:  Negative for abdominal pain and vomiting.  Genitourinary:  Negative for dysuria and hematuria.  Musculoskeletal:  Positive for myalgias. Negative for arthralgias and back pain.  Skin:  Negative for color change and rash.  Neurological:  Positive for headaches. Negative for seizures and syncope.  All other systems reviewed and are negative.  Physical Exam Updated Vital Signs BP 106/71 (BP Location: Right Arm)    Pulse (!) 101    Temp 98.8 F (37.1 C)    Resp 16    Ht 4\' 11"  (1.499 m)    Wt 54.4 kg    LMP 01/06/2021 (Approximate)    SpO2 100%    BMI 24.24 kg/m  Physical Exam Vitals and nursing note reviewed.  Constitutional:      General: She is not in acute distress.    Appearance: She is well-developed.  HENT:     Head: Normocephalic and atraumatic.  Eyes:     General: Lids are normal. Vision grossly intact.     Conjunctiva/sclera: Conjunctivae normal.  Cardiovascular:     Rate and Rhythm: Normal  rate and regular rhythm.     Heart sounds: No murmur heard. Pulmonary:     Effort: Pulmonary effort is normal. No respiratory distress.     Breath sounds: Normal breath sounds.  Abdominal:     Palpations: Abdomen is soft.     Tenderness: There is no abdominal tenderness.  Musculoskeletal:        General: No swelling.     Cervical back: Neck supple.     Comments: No neck stiffness or rigidity  Skin:    General: Skin is warm and dry.     Capillary Refill: Capillary refill takes less than 2 seconds.  Neurological:     General: No focal deficit present.     Mental Status: She is alert and oriented to person,  place, and time.     GCS: GCS eye subscore is 4. GCS verbal subscore is 5. GCS motor subscore is 6.     Cranial Nerves: Cranial nerves 2-12 are intact.     Sensory: Sensation is intact.     Motor: Motor function is intact.     Coordination: Coordination is intact.     Gait: Gait is intact.  Psychiatric:        Mood and Affect: Mood normal.    ED Results / Procedures / Treatments   Labs (all labs ordered are listed, but only abnormal results are displayed) Labs Reviewed - No data to display  EKG None  Radiology CT HEAD WO CONTRAST (5MM)  Result Date: 01/22/2021 CLINICAL DATA:  Headache for 4 days. EXAM: CT HEAD WITHOUT CONTRAST TECHNIQUE: Contiguous axial images were obtained from the base of the skull through the vertex without intravenous contrast. RADIATION DOSE REDUCTION: This exam was performed according to the departmental dose-optimization program which includes automated exposure control, adjustment of the mA and/or kV according to patient size and/or use of iterative reconstruction technique. COMPARISON:  None. FINDINGS: Brain: No evidence of acute infarction, hemorrhage, hydrocephalus, extra-axial collection or mass lesion/mass effect. Vascular: No hyperdense vessel or unexpected calcification. Skull: Normal. Negative for fracture or focal lesion. Sinuses/Orbits: There is opacification of the left frontal sinus and some of the anterior left ethmoid air cells. Other visible sinuses, visualized mastoid air cells are clear. Other: None. IMPRESSION: 1. No acute intracranial CT findings. 2. Left frontal and left ethmoid sinus opacification consistent with sinusitis, acuity indeterminate. Electronically Signed   By: Telford Nab M.D.   On: 01/22/2021 04:47    Procedures Procedures    Medications Ordered in ED Medications  sodium chloride 0.9 % bolus 1,000 mL (has no administration in time range)  ketorolac (TORADOL) 15 MG/ML injection 15 mg (has no administration in time range)   metoCLOPramide (REGLAN) injection 10 mg (has no administration in time range)  diphenhydrAMINE (BENADRYL) injection 12.5 mg (has no administration in time range)  magnesium sulfate IVPB 2 g 50 mL (has no administration in time range)  ibuprofen (ADVIL) tablet 600 mg (600 mg Oral Given 01/22/21 0314)    ED Course/ Medical Decision Making/ A&P                           Medical Decision Making  4:46 PM  31 yo female presenting with headache. Pt states she developed headache three days ago associated with fever of 100.51F orally and chills after dx of UTI but has not yet started antibiotics. Pt is Aox3, no acute distress, afebrile, with stable vitals. Physical exam demonstrates no neurovascular  deficits. No limb ataxia. No abnormal gait. CT ordered by triage team stable. Suggestive of possible sinusitis. Pt denies nasal congestion, rhinorrhea, or sinus pressure. Pt given IV fluids, Toradol, Reglan, and benadryl for headache.   Fever: -Recent dx of UTI w/o starting antibiotics yet -Denies cough -Denies abdominal pain, nausea, vomiting, diarrhea -No neck stiffness/rigidity. No neuro deficits. Doubt meningitis  Fever, chills, myalgias likely secondary to untreated UTI. Patient in no distress and overall condition improved here in the ED. Pt states she does not need new prescription for UTI but requests I send it directly to pharmacy for her-originally written prescription. Detailed discussions were had with the patient regarding current findings, and need for close f/u with PCP or on call doctor. Signs and symptoms of meningitis explained in daily. The patient has been instructed to return immediately if the symptoms worsen in any way for re-evaluation. Patient verbalized understanding and is in agreement with current care plan. All questions answered prior to discharge.         Final Clinical Impression(s) / ED Diagnoses Final diagnoses:  Other migraine without status migrainosus, not  intractable  Urinary tract infection without hematuria, site unspecified    Rx / DC Orders ED Discharge Orders     None         Lianne Cure, DO [id number]

## 2021-01-22 NOTE — ED Provider Triage Note (Signed)
Emergency Medicine Provider Triage Evaluation Note  Mary Monroe , a 31 y.o. female  was evaluated in triage.  Pt complains of HA. Began yesterday.  Seen yesterday for flulike symptoms and headache, generalized body aches have resolved but no cough or nasal congestion.  No history of significant headaches.  Has tried Tylenol and ibuprofen without improvement.  Associated light sensitivity  Review of Systems  Positive: HA, photophobia Negative: Cough, nasal congestion  Physical Exam  Ht 4\' 11"  (1.499 m)    Wt 54.4 kg    LMP 01/06/2021 (Approximate)    BMI 24.24 kg/m  Gen:   Awake, no distress   Resp:  Normal effort  MSK:   Moves extremities without difficulty  Other:  No focal neuro deficits  Medical Decision Making  Medically screening exam initiated at 2:26 AM.  Appropriate orders placed.  01/08/2021 was informed that the remainder of the evaluation will be completed by another provider, this initial triage assessment does not replace that evaluation, and the importance of remaining in the ED until their evaluation is complete.     [name], PA-C 01/22/21 01/24/21

## 2021-01-22 NOTE — ED Notes (Signed)
Given a recliner for comfort

## 2021-01-22 NOTE — ED Triage Notes (Signed)
Pt reported to  ED with c/o headache since yesterday. States she is having throbbing in temple area and cotton mouth. Pt also reports sensitivity to light and sound. Does not have history of the same. Reports being seen in this ED yesterday for flu-like symptoms.

## 2021-06-29 ENCOUNTER — Emergency Department (HOSPITAL_COMMUNITY): Payer: 59

## 2021-06-29 ENCOUNTER — Other Ambulatory Visit: Payer: Self-pay

## 2021-06-29 ENCOUNTER — Emergency Department (HOSPITAL_COMMUNITY)
Admission: EM | Admit: 2021-06-29 | Discharge: 2021-06-30 | Discharge status: Against Medical Advice | Payer: 59 | Attending: Emergency Medicine | Admitting: Emergency Medicine

## 2021-06-29 ENCOUNTER — Encounter (HOSPITAL_COMMUNITY): Payer: Self-pay

## 2021-06-29 DIAGNOSIS — R079 Chest pain, unspecified: Secondary | ICD-10-CM | POA: Diagnosis present

## 2021-06-29 DIAGNOSIS — Z5321 Procedure and treatment not carried out due to patient leaving prior to being seen by health care provider: Secondary | ICD-10-CM | POA: Diagnosis not present

## 2021-06-29 LAB — BASIC METABOLIC PANEL
Anion gap: 9 (ref 5–15)
BUN: 6 mg/dL (ref 6–20)
CO2: 24 mmol/L (ref 22–32)
Calcium: 10.4 mg/dL — ABNORMAL HIGH (ref 8.9–10.3)
Chloride: 107 mmol/L (ref 98–111)
Creatinine, Ser: 0.81 mg/dL (ref 0.44–1.00)
GFR, Estimated: >60 mL/min (ref >60)
Glucose, Bld: 95 mg/dL (ref 70–99)
Potassium: 4 mmol/L (ref 3.5–5.1)
Sodium: 140 mmol/L (ref 135–145)

## 2021-06-29 LAB — CBC
HCT: 41.2 % (ref 36.0–46.0)
Hemoglobin: 12.9 g/dL (ref 12.0–15.0)
MCH: 26.8 pg (ref 26.0–34.0)
MCHC: 31.3 g/dL (ref 30.0–36.0)
MCV: 85.7 fL (ref 80.0–100.0)
Platelets: 279 10*3/uL (ref 150–400)
RBC: 4.81 MIL/uL (ref 3.87–5.11)
RDW: 14.8 % (ref 11.5–15.5)
WBC: 6.3 10*3/uL (ref 4.0–10.5)
nRBC: 0 % (ref 0.0–0.2)

## 2021-06-29 LAB — I-STAT BETA HCG BLOOD, ED (MC, WL, AP ONLY): I-stat hCG, quantitative: <5 m[IU]/mL (ref <5)

## 2021-06-29 LAB — TROPONIN I (HIGH SENSITIVITY): Troponin I (High Sensitivity): <2 ng/L (ref <18)

## 2021-06-29 NOTE — ED Provider Triage Note (Signed)
Emergency Medicine Provider Triage Evaluation Note  Mary Monroe , a 31 y.o. female  was evaluated in triage.  Pt complains of central chest pain onset last night with inspiration.  Notes she is from [place] and was told previously that she had inflammation of the chest.  Denies shortness of breath, nausea, vomiting.  No meds tried.  Review of Systems  Positive: As per HPI above Negative:   Physical Exam  BP 122/76 (BP Location: Left Arm)   Pulse 73   Temp 98.4 F (36.9 C) (Oral)   Resp 16   Ht 4\' 11"  (1.499 m)   Wt 74.4 kg   LMP 06/18/2021 (Approximate)   SpO2 100%   BMI 33.12 kg/m  Gen:   Awake, no distress   Resp:  Normal effort MSK:   Moves extremities without difficulty  Other:  Tenderness to palpation noted to chest wall without overlying skin changes.  Medical Decision Making  Medically screening exam initiated at 5:37 PM.  Appropriate orders placed.  08/18/2021 was informed that the remainder of the evaluation will be completed by another provider, this initial triage assessment does not replace that evaluation, and the importance of remaining in the ED until their evaluation is complete.  Work-up initiated   Garald Rhew A, PA-C 06/29/21 1738

## 2021-06-29 NOTE — ED Triage Notes (Signed)
Pt reports central chest pain onset last night associated with inspiration; denies SOB/n/v/d. She is also reporting internal pain to the right breast onset this morning.

## 2021-06-29 NOTE — ED Notes (Signed)
Patient called x 2 with no response. 

## 2021-08-12 DIAGNOSIS — E210 Primary hyperparathyroidism: Secondary | ICD-10-CM | POA: Insufficient documentation

## 2021-08-26 ENCOUNTER — Ambulatory Visit: Payer: 59

## 2021-10-06 ENCOUNTER — Encounter: Payer: 59 | Admitting: Nurse Practitioner

## 2021-10-14 ENCOUNTER — Encounter: Payer: 59 | Admitting: Nurse Practitioner

## 2021-10-20 ENCOUNTER — Encounter: Payer: 59 | Admitting: Nurse Practitioner

## 2021-12-17 ENCOUNTER — Encounter: Payer: 59 | Admitting: Obstetrics & Gynecology

## 2021-12-20 ENCOUNTER — Encounter: Payer: 59 | Admitting: Nurse Practitioner
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
## 2023-01-19 ENCOUNTER — Encounter: Payer: Self-pay | Admitting: Family Medicine

## 2023-01-19 ENCOUNTER — Ambulatory Visit (INDEPENDENT_AMBULATORY_CARE_PROVIDER_SITE_OTHER): Payer: BC Managed Care – PPO | Admitting: Family Medicine

## 2023-01-19 VITALS — BP 121/77 | Ht 59.0 in | Wt 162.0 lb

## 2023-01-19 DIAGNOSIS — R768 Other specified abnormal immunological findings in serum: Secondary | ICD-10-CM | POA: Diagnosis not present

## 2023-01-19 DIAGNOSIS — R0789 Other chest pain: Secondary | ICD-10-CM | POA: Diagnosis not present

## 2023-01-19 DIAGNOSIS — E210 Primary hyperparathyroidism: Secondary | ICD-10-CM

## 2023-01-19 DIAGNOSIS — G8929 Other chronic pain: Secondary | ICD-10-CM

## 2023-01-19 NOTE — Progress Notes (Signed)
 DATE OF VISIT: 01/19/2023        Clotilda Monroe DOB: [birth year] MRN: [id number]  CC:  chest wall pain  History- [facility] is a 33 y.o. female for evaluation and treatment of chest wall pain.  Referred by PCP - Carola Mace, PA-C - last seen by PCP 12/26/22 - review of visit notes from 12/26/22 showed acute on chronic flare of chest pain.  Was uncertain if due to underlying hyperparathyroid or other orthopedic/MSK issue - labs at that visit and completed 01/10/23 showed: -- PTH elevated at 88 -- Ca elevated at 10.4  -- c/w primary hyperparathyroidism  PMH significant for hyperparathyroid diagnosed about 1 year ago.  Also (+)ANA 07/07/21 with mildly(+)Sjogren 1.5; f/u ANA negative 07/12/21 - reports seen by Rheum and did not think hx was c/w Sjogren's - was recommended f/u prn and consideration of repeat labs in 1 year  Patient reports intermittent chest wall/rib pain over the last several years.  She thinks it may have started when she was around 33 years old. Has intermittent flares that will sometimes last up to weeks at a time. Previously living in [place], [place] , was told at the time that may be related to costochondritis. She will use NSAIDs as needed, symptoms may resolve temporarily, but then will return. When seen by PCP 12/26/2022 was experiencing acute flare that started about 2 weeks prior to that visit.  No injury or trauma. With flares will feel pain along the anterior chest wall, but also has pain along the upper back and along the posterior aspect of the ribs. Pain sometimes worse with lifting activities. Typically takes ibuprofen  800 to 1600 mg a day with some improvement. Denies any history of injury or trauma Has been told she might have a slight curvature of her spine, but no prominent or significant scoliosis. She has discussed with surgeon consideration of possible breast reduction to see if that may help her symptoms.  She has not scheduled this at this point She  does have known primary hyperparathyroidism as noted above.  Has been told that surgical intervention is the best treatment option for this.  Not scheduled any surgical intervention at this point. Recent labs do show elevated calcium and elevated PTH consistent with primary hyperparathyroidism. -Hyperparathyroidism was diagnosed about 1 year ago, but looking back in her chart she has had elevated calcium levels extending back to 2021 when she was seen in the ER Denies fevers, chills, night sweats, weight loss No known family history of underlying autoimmune or connective tissue disorders  Past Medical History Past Medical History:  Diagnosis Date   Urinary tract infection     Past Surgical History History reviewed. No pertinent surgical history.  Medications Current Outpatient Medications  Medication Sig Dispense Refill   cephALEXin  (KEFLEX ) 500 MG capsule Take 1 capsule (500 mg total) by mouth 3 (three) times daily. 21 capsule 0   cyclobenzaprine  (FLEXERIL ) 10 MG tablet Take 1 tablet (10 mg total) by mouth 2 (two) times daily as needed for muscle spasms. 20 tablet 0   HYDROcodone -acetaminophen  (NORCO) 5-325 MG tablet Take 1 tablet by mouth every 6 (six) hours as needed for severe pain. 12 tablet 0   HYDROcodone -acetaminophen  (NORCO/VICODIN) 5-325 MG tablet Take 1 tablet by mouth every 4 (four) hours as needed for severe pain. 10 tablet 0   ibuprofen  (ADVIL ) 600 MG tablet Take 1 tablet (600 mg total) by mouth every 6 (six) hours as needed. 30 tablet 0   ibuprofen  (ADVIL ) 600 MG  tablet Take 1 tablet (600 mg total) by mouth every 6 (six) hours as needed for headache. 30 tablet 0   lidocaine  (XYLOCAINE ) 2 % solution Use as directed 15 mLs in the mouth or throat as needed for mouth pain. (Patient not taking: Reported on 05/02/2019) 100 mL 0   naproxen  (NAPROSYN ) 500 MG tablet Take 1 tablet (500 mg total) by mouth 2 (two) times daily. (Patient not taking: Reported on 05/02/2019) 30 tablet 0   No  current facility-administered medications for this visit.    Allergies has no known allergies.  Family History - reviewed per EMR and intake form  Social History   reports current alcohol use.  reports that she has never smoked. She does not have any smokeless tobacco history on file.  reports no history of drug use.   EXAM: Vitals: BP 121/77   Ht 4' 11 (1.499 m)   Wt 162 lb (73.5 kg)   BMI 32.72 kg/m  General: AOx3, NAD, pleasant SKIN: no rashes or lesions, skin clean, dry, intact MSK: Spine: Thoracic and lumbar spine without any signs of scoliosis.  No acute deformity.  No midline or paraspinal tenderness.  Good range of motion without pain  Ribs/chest: No deformity or acute abnormalities noted.  She is tender to palpation anteriorly over the sternum, sternocostal junctions, and costochondral junctions throughout entire rib cage.  She has no tenderness palpation along the lateral ribs.  She has diffuse tenderness to palpation across all rib angles bilaterally.  No palpable step-offs or deformity.  She has symmetric rib motion bilaterally with symmetric chest rise.  Gait: Normal gait.  No leg length difference.  Normal overall alignment.  NEURO: sensation intact to light touch  VASC: no edema  IMAGING: CT chest without contrast 04/01/2022 through [facility] showing: IMPRESSION:  1. No acute findings. Specifically, no evidence of abnormality of the chest wall or sternum.  2.  Apparent cysts of the right lobe of the liver.  *Images were not available for review, could only review report today  Chest x-ray 2 views 06/29/21 personally reviewed by me today and showing no acute abnormalities.  No significant thoracic spine abnormalities. Assessment & Plan Chest wall pain, chronic Acute on chronic chest wall pain/rib pain, intermittent flares that can last several weeks.  Symptoms initially started over 10+ years ago.  Uncertain etiology, may be related to bone pain from  underlying hyperparathyroidism which is noted to contribute to chest wall/rib pain versus underlying musculoskeletal condition versus autoimmune/inflammatory condition -Based on review of labs, review of normal imaging, otherwise relatively unremarkable exam, I suspect underlying etiology for her recurrent symptoms is likely her hyperparathyroidism  Plan: -Visit notes from PCP reviewed during visit today with summary as noted in the HPI -Prior labs from visits with PCP reviewed and as noted in HPI -Prior imaging studies reviewed as noted above showing no significant abnormalities -Labs: Will obtain updated autoimmune/rheumatologic labs to see if there could be underlying inflammatory component contributing to her symptoms.  Will check ANA with reflex, rheumatoid factor, anti-CCP, uric acid, CRP, ESR.  She will go to [facility] to get these completed.  I will contact her with results once available -Advised patient best treatment for hyperparathyroid would be surgical intervention.  If underlying chest wall/rib pain is related to hyperparathyroid, with improved PTH levels and improve calcium levels would expect pain to improve. -She has seen surgeon to discuss possible breast reduction.  This could potentially help her upper back pain, but I feel less  likely overall to improve all of her chest wall/rib pain.  Would recommend consideration of parathyroid surgery prior to considering breast reduction. Hyperparathyroid bone disease (HCC) Primary hyperparathyroidism with recent labs showing elevated calcium and PTH.  Has been experiencing recurrent intermittent flares of chest wall pain/rib pain which is highly suspicious for complication of hyperparathyroidism  Plan: -Visit notes from PCP reviewed during visit today with summary as noted in the HPI -Prior labs from visits with PCP reviewed and as noted in HPI -Prior imaging studies reviewed as noted above showing no significant abnormalities -Advised  patient best treatment for hyperparathyroid would be surgical intervention.  If underlying chest wall/rib pain is related to hyperparathyroid, with improved PTH levels and improve calcium levels would expect pain to improve. Positive ANA (antinuclear antibody) History of positive ANA and mildly positive Sjogren's.  Could have underlying autoimmune/inflammatory component leading to her chest wall/rib pain  PLAN: -Labs: Will obtain updated autoimmune/rheumatologic labs to see if there could be underlying inflammatory component contributing to her symptoms.  Will check ANA with reflex, rheumatoid factor, anti-CCP, uric acid, CRP, ESR.  She will go to [facility] to get these completed.  I will contact her with results once available -Consider follow-up with rheumatology pending lab results  Patient expressed understanding & agreement with above.  Encounter Diagnoses  Name Primary?   Chest wall pain, chronic Yes   Hyperparathyroid bone disease (HCC)    Positive ANA (antinuclear antibody)     Orders Placed This Encounter  Procedures   Rheumatoid Factor   Sedimentation rate   C-reactive protein   Uric acid   ANA w/Reflex   CYCLIC CITRUL PEPTIDE ANTIBODY, IGG/IGA    Orders Placed This Encounter  Procedures   Rheumatoid Factor   Sedimentation rate   C-reactive protein   Uric acid   ANA w/Reflex   CYCLIC CITRUL PEPTIDE ANTIBODY, IGG/IGA

## 2023-01-20 ENCOUNTER — Emergency Department (HOSPITAL_COMMUNITY)
Admission: EM | Admit: 2023-01-20 | Discharge: 2023-01-21 | Disposition: A | Discharge status: Home / Self Care | Payer: BC Managed Care – PPO | Attending: Emergency Medicine | Admitting: Emergency Medicine

## 2023-01-20 ENCOUNTER — Encounter (HOSPITAL_COMMUNITY): Payer: Self-pay | Admitting: *Deleted

## 2023-01-20 ENCOUNTER — Other Ambulatory Visit: Payer: Self-pay

## 2023-01-20 DIAGNOSIS — R059 Cough, unspecified: Secondary | ICD-10-CM | POA: Diagnosis not present

## 2023-01-20 DIAGNOSIS — J101 Influenza due to other identified influenza virus with other respiratory manifestations: Secondary | ICD-10-CM | POA: Insufficient documentation

## 2023-01-20 NOTE — ED Triage Notes (Signed)
 The pt is c/o a migraine headache since yesterday  cough today with body aches

## 2023-01-21 ENCOUNTER — Emergency Department (HOSPITAL_COMMUNITY): Payer: BC Managed Care – PPO

## 2023-01-21 LAB — RESPIRATORY PANEL BY PCR

## 2023-01-21 MED ORDER — KETOROLAC TROMETHAMINE 15 MG/ML IJ SOLN
15.0000 mg | Freq: Once | INTRAMUSCULAR | Status: AC
  Administered 2023-01-21: 15 mg via INTRAVENOUS
  Filled 2023-01-21: qty 1

## 2023-01-21 MED ORDER — PROCHLORPERAZINE EDISYLATE 10 MG/2ML IJ SOLN
10.0000 mg | Freq: Once | INTRAMUSCULAR | Status: AC
Start: 2023-01-21 — End: 2023-01-21
  Administered 2023-01-21: 10 mg via INTRAVENOUS
  Filled 2023-01-21: qty 2

## 2023-01-21 MED ORDER — ACETAMINOPHEN 325 MG PO TABS
650.0000 mg | ORAL_TABLET | Freq: Once | ORAL | Status: DC
  Filled 2023-01-21: qty 2

## 2023-01-21 NOTE — ED Provider Notes (Signed)
 [facility] Provider Note   CSN: [id number] Arrival date & time: 01/20/23  2238     History  Chief Complaint  Patient presents with   Generalized Body Aches    Mary Monroe is a 33 y.o. female.  The history is provided by the patient.  Patient presents for flulike illness.  She reports yesterday she started having cough and bodyaches.  Starting today she began having a headache that is worsening.  Similar to prior migraines.  She reports she gets multiple headaches per month similar to this.  She is felt fevers.  No vomiting or diarrhea.  No focal weakness.  Minimal sore throat.    Past Medical History:  Diagnosis Date   Urinary tract infection     Home Medications Prior to Admission medications   Not on File      Allergies    Patient has no known allergies.    Review of Systems   Review of Systems  Constitutional:  Positive for fatigue.  Respiratory:  Positive for cough. Negative for shortness of breath.   Gastrointestinal:  Negative for diarrhea and vomiting.  Musculoskeletal:  Positive for myalgias.    Physical Exam Updated Vital Signs BP 119/79   Pulse (!) 110   Temp (!) 101 F (38.3 C) (Oral)   Resp 16   Ht 1.499 m (4' 11)   Wt 73.5 kg   SpO2 97%   BMI 32.72 kg/m  Physical Exam CONSTITUTIONAL: Well developed/well nourished, patient tearful HEAD: Normocephalic/atraumatic EYES: EOMI/PERRL ENMT: Mucous membranes moist,, no stridor or drooling NECK: supple no meningeal signs CV: S1/S2 noted, no murmurs/rubs/gallops noted LUNGS: Lungs are clear to auscultation bilaterally, no apparent distress ABDOMEN: soft, nontender NEURO:Awake/alert, face symmetric, no arm or leg drift is noted Equal 5/5 strength with shoulder abduction, elbow flex/extension, wrist flex/extension in upper extremities and equal hand grips bilaterally Cranial nerves 3/4/5/6/07/18/08/11/12 tested and intact SKIN: warm, color normal PSYCH:  Anxious and tearful  ED Results / Procedures / Treatments   Labs (all labs ordered are listed, but only abnormal results are displayed) Labs Reviewed  RESPIRATORY PANEL BY PCR - Abnormal; Notable for the following components:      Result Value   Influenza A H1 2009 DETECTED (*)    All other components within normal limits    EKG EKG Interpretation Date/Time:  Friday January 20 2023 22:47:27 EST Ventricular Rate:  107 PR Interval:  144 QRS Duration:  78 QT Interval:  310 QTC Calculation: 413 R Axis:   69  Text Interpretation: Sinus tachycardia Otherwise normal ECG Interpretation limited secondary to artifact Confirmed by Midge Golas ([id number]) on 01/21/2023 12:13:20 AM  Radiology DG Chest Portable 1 View Result Date: 01/21/2023 CLINICAL DATA:  cough EXAM: PORTABLE CHEST 1 VIEW COMPARISON:  Chest x-ray 06/29/2021 FINDINGS: Patient is rotated. The heart and mediastinal contours are within normal limits. Low lung volumes. No focal consolidation. No pulmonary edema. No pleural effusion. No pneumothorax. No acute osseous abnormality. IMPRESSION: Low lung volumes and patient rotation. No definite acute cardiopulmonary abnormality. Recommend repeat chest x-ray PA and lateral view for further evaluation. Electronically Signed   By: Morgane  Naveau M.D.   On: 01/21/2023 01:51    Procedures Procedures    Medications Ordered in ED Medications  prochlorperazine  (COMPAZINE ) injection 10 mg (10 mg Intravenous Given 01/21/23 0037)  ketorolac  (TORADOL ) 15 MG/ML injection 15 mg (15 mg Intravenous Given 01/21/23 0035)    ED Course/ Medical Decision Making/ A&P  Clinical Course as of 01/21/23 0315  Sat Jan 21, 2023  0315 Patient declined Tylenol , claims she has an allergy [DW]  [phone] Patient does feel improved, resting comfortably no acute distress.  No convincing signs of pneumonia on x-ray Mild tachycardia likely due to her fever.  There is no hypoxia or signs of respiratory  distress  Patient is otherwise healthy at baseline, therefore discussed signs and symptoms of worsening influenza, will not prescribe any medicines at this time [DW]    Clinical Course User Index [DW] Midge Golas, MD                                 Medical Decision Making Amount and/or Complexity of Data Reviewed Radiology: ordered.  Risk Prescription drug management.   This patient presents to the ED for concern of headache, this involves an extensive number of treatment options, and is a complaint that carries with it a high risk of complications and morbidity.  The differential diagnosis includes but is not limited to subarachnoid hemorrhage, intracranial hemorrhage, meningitis, encephalitis, CVST, temporal arteritis, idiopathic intracranial hypertension, migraine, influenza   Comorbidities that complicate the patient evaluation: Patient's presentation is complicated by their history of migraines   Additional history obtained: Records reviewed Primary Care Documents  Lab Tests: I Ordered, and personally interpreted labs.  The pertinent results include: Positive for influenza   Medicines ordered and prescription drug management: I ordered medication including Compazine  and Toradol  for headache Reevaluation of the patient after these medicines showed that the patient    improved  Reevaluation: After the interventions noted above, I reevaluated the patient and found that they have :improved  Complexity of problems addressed: Patient's presentation is most consistent with  acute presentation with potential threat to life or bodily function  Disposition: After consideration of the diagnostic results and the patient's response to treatment,  I feel that the patent would benefit from discharge   .           Final Clinical Impression(s) / ED Diagnoses Final diagnoses:  Influenza A    Rx / DC Orders ED Discharge Orders     None         Midge Golas, MD 01/21/23 [phone]

## 2023-01-21 NOTE — ED Notes (Addendum)
 Assumed care of pt, found her crying in bed.  She states she has had a headache all day and her whole body hurts.  RN placed pt on O2 monitor and bp.  Pt is alert and oriented at this time.

## 2023-02-01 DIAGNOSIS — R768 Other specified abnormal immunological findings in serum: Secondary | ICD-10-CM | POA: Diagnosis not present

## 2023-02-01 DIAGNOSIS — R0789 Other chest pain: Secondary | ICD-10-CM | POA: Diagnosis not present

## 2023-02-01 DIAGNOSIS — E210 Primary hyperparathyroidism: Secondary | ICD-10-CM | POA: Diagnosis not present

## 2023-02-01 DIAGNOSIS — G8929 Other chronic pain: Secondary | ICD-10-CM | POA: Diagnosis not present

## 2023-02-02 ENCOUNTER — Encounter: Payer: Self-pay | Admitting: Family Medicine

## 2023-02-02 NOTE — Progress Notes (Signed)
Labs reviewed.  ANA pending, other labs normal.  Based on current results suspect symptoms related to hyperparathyroid.  Will reach out to patient with ANA results once available.

## 2023-02-03 LAB — CYCLIC CITRUL PEPTIDE ANTIBODY, IGG/IGA: Cyclic Citrullin Peptide Ab: 2 U (ref 0–19)

## 2023-02-04 LAB — RHEUMATOID FACTOR: Rheumatoid fact SerPl-aCnc: <10 [IU]/mL (ref <14.0)

## 2023-02-04 LAB — C-REACTIVE PROTEIN: CRP: <1 mg/L (ref 0–10)

## 2023-02-04 LAB — URIC ACID: Uric Acid: 6.2 mg/dL (ref 2.6–6.2)

## 2023-02-04 LAB — SEDIMENTATION RATE: Sed Rate: 7 mm/h (ref 0–32)

## 2023-02-04 LAB — ANA W/REFLEX: ANA Titer 1: NEGATIVE

## 2023-02-06 NOTE — Progress Notes (Signed)
Remainder of labs including ANA with reflex were negative/normal.  Symptoms likely related to underlying hyperparathyroidism.  Should f/u with PCP and specialists regarding her symptoms.  F/u with [place] prn.

## 2023-07-31 ENCOUNTER — Encounter (HOSPITAL_BASED_OUTPATIENT_CLINIC_OR_DEPARTMENT_OTHER): Payer: Self-pay | Admitting: Emergency Medicine

## 2023-07-31 ENCOUNTER — Other Ambulatory Visit: Payer: Self-pay

## 2023-07-31 ENCOUNTER — Emergency Department (HOSPITAL_BASED_OUTPATIENT_CLINIC_OR_DEPARTMENT_OTHER)
Admission: EM | Admit: 2023-07-31 | Discharge: 2023-07-31 | Disposition: A | Discharge status: Home / Self Care | Attending: Emergency Medicine | Admitting: Emergency Medicine

## 2023-07-31 DIAGNOSIS — Z202 Contact with and (suspected) exposure to infections with a predominantly sexual mode of transmission: Secondary | ICD-10-CM | POA: Diagnosis present

## 2023-07-31 HISTORY — DX: Hypoparathyroidism, unspecified: E20.9

## 2023-07-31 LAB — URINALYSIS, ROUTINE W REFLEX MICROSCOPIC
Bilirubin Urine: NEGATIVE
Glucose, UA: NEGATIVE mg/dL
Hgb urine dipstick: NEGATIVE
Ketones, ur: NEGATIVE mg/dL
Nitrite: NEGATIVE
Specific Gravity, Urine: 1.027 (ref 1.005–1.030)
pH: 6 (ref 5.0–8.0)

## 2023-07-31 NOTE — ED Provider Notes (Signed)
  [place] EMERGENCY DEPARTMENT AT [facility] Provider Note   CSN: [id number] Arrival date & time: 07/31/23  2013     Patient presents with: No chief complaint on file.  HPI Mary Monroe is a 33 y.o. female presenting for STD check.  She states she recently had sexual intercourse with her ex boyfriend and wanted be checked out.  She denies any associated symptoms.  Denies urinary symptoms, abdominal pain or pain with intercourse.  She recently had BV and was treated with antibiotics but no longer has the symptoms.   HPI     Prior to Admission medications   Not on File    Allergies: Patient has no known allergies.    Review of Systems  Updated Vital Signs BP 134/89 (BP Location: Right Arm)   Pulse 66   Temp 97.8 F (36.6 C)   Resp 18   LMP 07/17/2023 (Approximate)   SpO2 100%   Physical Exam Constitutional:      Appearance: Normal appearance.  HENT:     Head: Normocephalic.     Nose: Nose normal.  Eyes:     Conjunctiva/sclera: Conjunctivae normal.  Pulmonary:     Effort: Pulmonary effort is normal.  Neurological:     Mental Status: She is alert.  Psychiatric:        Mood and Affect: Mood normal.     (all labs ordered are listed, but only abnormal results are displayed) Labs Reviewed  HIV ANTIBODY (ROUTINE TESTING W REFLEX)  RPR  GC/CHLAMYDIA PROBE AMP ([place]) NOT AT [facility]    EKG: None  Radiology: No results found.   Procedures   Medications Ordered in the ED - No data to display                                  Medical Decision Making Amount and/or Complexity of Data Reviewed Labs: ordered.   33 year old well-appearing female presenting for STD check.  Exam was unremarkable.  Sent off STD labs and advised her to follow-up on MyChart for results.  Given that she does not have symptoms do not feel that treatment is warranted at this time.  Advised her to abstain from sex until her her results are posted into inform sexual  partners to seek out treatment if she is positive for an STD as well as to return herself for treatment.  Discharge of the condition.     Final diagnoses:  Possible exposure to STD    ED Discharge Orders     None          Lang Norleen MARLA DEVONNA 07/31/23 2038    Dean Clarity, MD 08/01/23 [phone]

## 2023-07-31 NOTE — ED Notes (Signed)
 Reviewed AVS/discharge instruction with patient. Time allotted for and all questions answered. Patient is agreeable for d/c and escorted to ed exit by staff.

## 2023-07-31 NOTE — Discharge Instructions (Signed)
 Please follow-up on MyChart for results of your STD panel.  If you are positive please inform your sexual partner partners. In the meantime recommend the abstain from sex until your results are posted.  If you are positive for STI please go to your primary care doctor or the health department for treatment.

## 2023-07-31 NOTE — ED Triage Notes (Signed)
 Recently with an ex boyfriend wants to get checked out. Denies new symptoms. Recently treated for BV ( today is last day for abt)

## 2023-08-01 LAB — HIV ANTIBODY (ROUTINE TESTING W REFLEX): HIV Screen 4th Generation wRfx: NONREACTIVE

## 2023-08-01 LAB — RPR: RPR Ser Ql: NONREACTIVE

## 2023-08-02 LAB — GC/CHLAMYDIA PROBE AMP (~~LOC~~) NOT AT ARMC
Chlamydia: NEGATIVE
Comment: NEGATIVE
Comment: NORMAL
Neisseria Gonorrhea: NEGATIVE

## 2023-09-18 ENCOUNTER — Encounter: Payer: Self-pay | Admitting: Emergency Medicine

## 2023-09-18 ENCOUNTER — Ambulatory Visit
Admission: EM | Admit: 2023-09-18 | Discharge: 2023-09-18 | Disposition: A | Discharge status: Home / Self Care | Attending: Physician Assistant | Admitting: Physician Assistant

## 2023-09-18 DIAGNOSIS — Z113 Encounter for screening for infections with a predominantly sexual mode of transmission: Secondary | ICD-10-CM | POA: Diagnosis present

## 2023-09-18 NOTE — ED Provider Notes (Signed)
 [facility]    CSN: [id number] Arrival date & time: 09/18/23  0843      History   Chief Complaint Chief Complaint  Patient presents with   Exposure to STD    HPI Mary Monroe is a 33 y.o. female.   Patient presents to urgent care for STD screening.  She reports that she does not currently have any symptoms.  She denies any known exposures.  She does report that she had some bumps on the back of her tongue for 3 weeks but these are not painful.  The history is provided by the patient.    Past Medical History:  Diagnosis Date   Hypoparathyroidism [facility])    Urinary tract infection     Patient Active Problem List   Diagnosis Date Noted   Primary hyperparathyroidism (HCC) 08/12/2021   Vitamin D deficiency 05/31/2018    History reviewed. No pertinent surgical history.  OB History   No obstetric history on file.      Home Medications    Prior to Admission medications   Medication Sig Start Date End Date Taking? Authorizing Provider  cetirizine (ZYRTEC) 10 MG tablet Take 10 mg by mouth. Patient not taking: Reported on 09/18/2023 06/03/22   [provider]  Cholecalciferol 1.25 MG (50000 UT) capsule take 1 capsule with food weekly for 8 weeks Patient not taking: Reported on 09/18/2023 05/31/18   [provider]  desogestrel-ethinyl estradiol (AZURETTE) 0.15-0.02/0.01 MG (21/5) tablet Take 1 tablet by mouth daily. Patient not taking: Reported on 09/18/2023 11/26/22   [provider]  doxycycline (VIBRA-TABS) 100 MG tablet Take 100 mg by mouth 2 (two) times daily. Patient not taking: Reported on 09/18/2023 05/28/23   [provider]  emtricitabine-tenofovir (TRUVADA) 200-300 MG tablet Take 1 tablet by mouth. Patient not taking: Reported on 09/18/2023 05/31/23   [provider]  fluconazole (DIFLUCAN) 150 MG tablet Take by mouth. Patient not taking: Reported on 09/18/2023    [provider]  metroNIDAZOLE (FLAGYL) 500  MG tablet Take 500 mg by mouth 2 (two) times daily. Patient not taking: Reported on 09/18/2023    [provider]  norethindrone (AYGESTIN) 5 MG tablet START 3 DAYS BEFORE YOUR EXPECTED PERIOD. TAKE 1 TABLET BY MOUTH THREE TIMES A DAY FOR 10 DAYS Patient not taking: Reported on 09/18/2023    [provider]  TIVICAY 50 MG tablet Take 50 mg by mouth daily. Patient not taking: Reported on 09/18/2023 05/31/23   [provider]  triamcinolone cream (KENALOG) 0.1 % 1 Application. Patient not taking: Reported on 09/18/2023 08/18/21   [provider]  ulipristal acetate (ELLA) 30 MG tablet TAKE 1 TABLET BY MOUTH BY MOUTH AS SOON AS POSSIBLE Patient not taking: Reported on 09/18/2023 10/31/21   [provider]    Family History History reviewed. No pertinent family history.  Social History Social History   Tobacco Use   Smoking status: Never  Vaping Use   Vaping status: Never Used  Substance Use Topics   Alcohol use: Yes    Comment: socially   Drug use: Never     Allergies   Patient has no known allergies.   Review of Systems Review of Systems  Constitutional:  Negative for chills and fever.  Eyes:  Negative for discharge and redness.  Respiratory:  Negative for shortness of breath.   Gastrointestinal:  Negative for abdominal pain, nausea and vomiting.  Genitourinary:  Negative for genital sores and vaginal discharge.  Physical Exam Triage Vital Signs ED Triage Vitals  Encounter Vitals Group     BP      Girls Systolic BP Percentile      Girls Diastolic BP Percentile      Boys Systolic BP Percentile      Boys Diastolic BP Percentile      Pulse      Resp      Temp      Temp src      SpO2      Weight      Height      Head Circumference      Peak Flow      Pain Score      Pain Loc      Pain Education      Exclude from Growth Chart    No data found.  Updated Vital Signs BP 121/80 (BP Location: Left Arm)   Pulse 83   Temp  98.7 F (37.1 C) (Oral)   Resp 14   LMP 09/04/2023 (Approximate)   SpO2 98%   Visual Acuity Right Eye Distance:   Left Eye Distance:   Bilateral Distance:    Right Eye Near:   Left Eye Near:    Bilateral Near:     Physical Exam Vitals and nursing note reviewed.  Constitutional:      General: She is not in acute distress.    Appearance: Normal appearance. She is not ill-appearing.  HENT:     Head: Normocephalic and atraumatic.     Mouth/Throat:     Mouth: Mucous membranes are moist.     Pharynx: Oropharynx is clear. No oropharyngeal exudate or posterior oropharyngeal erythema.     Comments: Questionably enlarged posterior taste buds, no other acute findings to tongue Eyes:     Conjunctiva/sclera: Conjunctivae normal.  Cardiovascular:     Rate and Rhythm: Normal rate.  Pulmonary:     Effort: Pulmonary effort is normal. No respiratory distress.  Neurological:     Mental Status: She is alert.  Psychiatric:        Mood and Affect: Mood normal.        Behavior: Behavior normal.        Thought Content: Thought content normal.      UC Treatments / Results  Labs (all labs ordered are listed, but only abnormal results are displayed) Labs Reviewed  RPR  HIV ANTIBODY (ROUTINE TESTING W REFLEX)  CERVICOVAGINAL ANCILLARY ONLY    EKG   Radiology No results found.  Procedures Procedures (including critical care time)  Medications Ordered in UC Medications - No data to display  Initial Impression / Assessment and Plan / UC Course  I have reviewed the triage vital signs and the nursing notes.  Pertinent labs & imaging results that were available during my care of the patient were reviewed by me and considered in my medical decision making (see chart for details).    STD screening ordered including blood work at patient's request.  Advised to abstain from sexual activity while awaiting results.  Encouraged follow-up with any further concerns otherwise.  Final  Clinical Impressions(s) / UC Diagnoses   Final diagnoses:  Screening for STD (sexually transmitted disease)   Discharge Instructions   None    ED Prescriptions   None    PDMP not reviewed this encounter.   Billy Asberry FALCON, PA-C 09/18/23 1115

## 2023-09-18 NOTE — ED Triage Notes (Signed)
 Pt here for STD testing. Asymptomatic with potential exposures. No specific exposures known. Would like cytology swab and blood work. Pt does report new swollen bumps to back of her tongue x3 weeks. Does not appear to be swollen taste buds.

## 2023-09-19 LAB — CERVICOVAGINAL ANCILLARY ONLY
Chlamydia: NEGATIVE
Comment: NEGATIVE
Comment: NEGATIVE
Comment: NORMAL
Neisseria Gonorrhea: NEGATIVE
Trichomonas: NEGATIVE

## 2023-09-19 LAB — RPR: RPR Ser Ql: NONREACTIVE

## 2023-09-19 LAB — HIV ANTIBODY (ROUTINE TESTING W REFLEX): HIV Screen 4th Generation wRfx: NONREACTIVE

## 2023-10-14 ENCOUNTER — Encounter: Payer: Self-pay | Admitting: Emergency Medicine

## 2023-10-14 ENCOUNTER — Ambulatory Visit
Admission: EM | Admit: 2023-10-14 | Discharge: 2023-10-14 | Disposition: A | Discharge status: Home / Self Care | Attending: Nurse Practitioner | Admitting: Nurse Practitioner

## 2023-10-14 DIAGNOSIS — Z113 Encounter for screening for infections with a predominantly sexual mode of transmission: Secondary | ICD-10-CM | POA: Insufficient documentation

## 2023-10-14 DIAGNOSIS — N760 Acute vaginitis: Secondary | ICD-10-CM | POA: Insufficient documentation

## 2023-10-14 LAB — POCT URINE PREGNANCY: Preg Test, Ur: NEGATIVE

## 2023-10-14 MED ORDER — METRONIDAZOLE 500 MG PO TABS: 500.0000 mg | ORAL_TABLET | Freq: Two times a day (BID) | ORAL | 0 refills | Status: AC

## 2023-10-14 MED ORDER — FLUCONAZOLE 150 MG PO TABS: 150.0000 mg | ORAL_TABLET | ORAL | 0 refills | Status: AC

## 2023-10-14 NOTE — ED Provider Notes (Signed)
 [facility]    CSN: [id number] Arrival date & time: 10/14/23  9077      History   Chief Complaint Chief Complaint  Patient presents with   Exposure to STD    HPI Mary Monroe is a 33 y.o. female.   Discussed the use of AI scribe software for clinical note transcription with the patient, who gave verbal consent to proceed.   The patient presents with a two-day history of vaginal discomfort, odor, and abnormal discharge. She describes the discharge as white in color with a consistency that is neither thick nor thin. She also reports vaginal irritation and increased sensitivity, stating her vagina feels very, very sensitive, which she believes may be contributing to her discomfort. Symptoms have been associated with pain after sexual intercourse, with her most recent encounter occurring four days ago. She emphasizes that these symptoms are unusual for her, noting, I've never felt like this after sex before. Her last menstrual period began on 10/02/23. She is not currently using contraception. The patient reports two female sexual partners within the past three months and expresses concern for a possible sexually transmitted infection. She adds that she recently ended a relationship due to her partner's infidelity, which increases her concern for STD exposure.  The following sections of the patient's history were reviewed and updated as appropriate: allergies, current medications, past family history, past medical history, past social history, past surgical history, and problem list.         Past Medical History:  Diagnosis Date   Hypoparathyroidism    Urinary tract infection     Patient Active Problem List   Diagnosis Date Noted   Primary hyperparathyroidism 08/12/2021   Vitamin D deficiency 05/31/2018    History reviewed. No pertinent surgical history.  OB History   No obstetric history on file.      Home Medications    Prior to Admission  medications   Medication Sig Start Date End Date Taking? Authorizing Provider  cetirizine (ZYRTEC) 10 MG tablet Take 10 mg by mouth. Patient not taking: Reported on 09/18/2023 06/03/22   [provider]  Cholecalciferol 1.25 MG (50000 UT) capsule take 1 capsule with food weekly for 8 weeks Patient not taking: Reported on 09/18/2023 05/31/18   [provider]  desogestrel-ethinyl estradiol (AZURETTE) 0.15-0.02/0.01 MG (21/5) tablet Take 1 tablet by mouth daily. Patient not taking: Reported on 09/18/2023 11/26/22   [provider]  doxycycline (VIBRA-TABS) 100 MG tablet Take 100 mg by mouth 2 (two) times daily. Patient not taking: Reported on 09/18/2023 05/28/23   [provider]  emtricitabine-tenofovir (TRUVADA) 200-300 MG tablet Take 1 tablet by mouth. Patient not taking: Reported on 09/18/2023 05/31/23   [provider]  fluconazole (DIFLUCAN) 150 MG tablet Take by mouth. Patient not taking: Reported on 09/18/2023    [provider]  metroNIDAZOLE (FLAGYL) 500 MG tablet Take 500 mg by mouth 2 (two) times daily. Patient not taking: Reported on 09/18/2023    [provider]  norethindrone (AYGESTIN) 5 MG tablet START 3 DAYS BEFORE YOUR EXPECTED PERIOD. TAKE 1 TABLET BY MOUTH THREE TIMES A DAY FOR 10 DAYS Patient not taking: Reported on 09/18/2023    [provider]  TIVICAY 50 MG tablet Take 50 mg by mouth daily. Patient not taking: Reported on 09/18/2023 05/31/23   [provider]  triamcinolone cream (KENALOG) 0.1 % 1 Application. Patient not taking: Reported on 09/18/2023 08/18/21   [provider]  ulipristal acetate (ELLA) 30  MG tablet TAKE 1 TABLET BY MOUTH BY MOUTH AS SOON AS POSSIBLE Patient not taking: Reported on 09/18/2023 10/31/21   [provider]    Family History History reviewed. No pertinent family history.  Social History Social History   Tobacco Use   Smoking status: Never    Passive exposure:  Never  Vaping Use   Vaping status: Never Used  Substance Use Topics   Alcohol use: Yes    Comment: socially   Drug use: Never     Allergies   Patient has no known allergies.   Review of Systems Review of Systems  Constitutional:  Negative for fever.  HENT:  Negative for mouth sores.   Gastrointestinal:  Negative for abdominal pain, nausea and vomiting.  Genitourinary:  Positive for vaginal discharge (white). Negative for dyspareunia, dysuria, genital sores and menstrual problem (LMP: 10/02/23).       Vaginal discomfort, odor, mildly irritated. No itching.   Musculoskeletal:  Negative for back pain.  All other systems reviewed and are negative.    Physical Exam Triage Vital Signs ED Triage Vitals  Encounter Vitals Group     BP 10/14/23 1013 116/79     Girls Systolic BP Percentile --      Girls Diastolic BP Percentile --      Boys Systolic BP Percentile --      Boys Diastolic BP Percentile --      Pulse Rate 10/14/23 1013 76     Resp 10/14/23 1013 16     Temp 10/14/23 1013 98.6 F (37 C)     Temp Source 10/14/23 1013 Oral     SpO2 10/14/23 1013 97 %     Weight 10/14/23 1012 160 lb (72.6 kg)     Height --      Head Circumference --      Peak Flow --      Pain Score 10/14/23 1012 0     Pain Loc --      Pain Education --      Exclude from Growth Chart --    No data found.  Updated Vital Signs BP 116/79 (BP Location: Left Arm)   Pulse 76   Temp 98.6 F (37 C) (Oral)   Resp 16   Wt 160 lb (72.6 kg)   LMP 10/07/2023 (Approximate)   SpO2 97%   BMI 32.32 kg/m   Visual Acuity Right Eye Distance:   Left Eye Distance:   Bilateral Distance:    Right Eye Near:   Left Eye Near:    Bilateral Near:     Physical Exam Constitutional:      General: She is not in acute distress.    Appearance: Normal appearance. She is not ill-appearing, toxic-appearing or diaphoretic.  HENT:     Head: Normocephalic.     Nose: Nose normal.     Mouth/Throat:     Mouth: Mucous  membranes are moist.  Eyes:     Conjunctiva/sclera: Conjunctivae normal.  Cardiovascular:     Rate and Rhythm: Normal rate.  Pulmonary:     Effort: Pulmonary effort is normal.  Abdominal:     Palpations: Abdomen is soft.  Genitourinary:    Comments: Deferred; patient performed self-swab for Aptima testing  Musculoskeletal:        General: Normal range of motion.     Cervical back: Normal range of motion and neck supple.  Skin:    General: Skin is warm and dry.  Neurological:     General:  No focal deficit present.     Mental Status: She is alert and oriented to person, place, and time.  Psychiatric:        Mood and Affect: Mood normal.        Behavior: Behavior normal.      UC Treatments / Results  Labs (all labs ordered are listed, but only abnormal results are displayed) Labs Reviewed  POCT URINE PREGNANCY - Normal  CERVICOVAGINAL ANCILLARY ONLY    EKG   Radiology No results found.  Procedures Procedures (including critical care time)  Medications Ordered in UC Medications - No data to display  Initial Impression / Assessment and Plan / UC Course  I have reviewed the triage vital signs and the nursing notes.  Pertinent labs & imaging results that were available during my care of the patient were reviewed by me and considered in my medical decision making (see chart for details).    Patient presents due to vaginal discomfort, odor, discharge and STD testing. Tests obtained today include bacterial vaginosis, yeast, gonorrhea, chlamydia, trichomonas, HIV, and syphilis. Results are pending. Patient was counseled to abstain from sexual activity until all results have been received, any necessary treatment has been completed, and any symptoms, if present, have resolved. Safe sex practices were discussed and encouraged, including consistent condom use and regular screening with new partners. Patient advised they will only be contacted if any results are positive or  require follow-up; otherwise, they may review their results through MyChart.    Final Clinical Impressions(s) / UC Diagnoses   Final diagnoses:  None   Discharge Instructions   None    ED Prescriptions   None    PDMP not reviewed this encounter.   Iola Lukes, [place] 10/14/23 1120

## 2023-10-14 NOTE — Discharge Instructions (Addendum)
 Testing for bacteria, yeast, gonorrhea, chlamydia, trichomonas, HIV and syphilis (RPR)  is currently pending. It is important that you avoid any sexual activity until your test results have returned, your treatment is complete, and your symptoms have fully resolved. Because you are experiencing symptoms today, you have been prescribed oral antibiotics to treat a possible bacterial infection and an antifungal medication for possible yeast. It is very important to take all of the medication exactly as prescribed, even if you begin to feel better before finishing the course. You will only be contacted if any of your test results come back positive. However, you can view your results at any time by logging into your MyChart account.

## 2023-10-14 NOTE — ED Triage Notes (Signed)
 Pt presents c/o vaginal discomfort, odor, and white discharge x 2 days. Pt reports she started to feel discomfort after having sexual activities with her boyfriend. Pt last sexual encounter 4 days ago.

## 2023-10-16 ENCOUNTER — Ambulatory Visit (HOSPITAL_COMMUNITY): Payer: Self-pay

## 2023-10-16 LAB — CERVICOVAGINAL ANCILLARY ONLY
Bacterial Vaginitis (gardnerella): POSITIVE — AB
Candida Glabrata: NEGATIVE
Candida Vaginitis: NEGATIVE
Chlamydia: NEGATIVE
Comment: NEGATIVE
Comment: NEGATIVE
Comment: NEGATIVE
Comment: NEGATIVE
Comment: NEGATIVE
Comment: NORMAL
Neisseria Gonorrhea: NEGATIVE
Trichomonas: NEGATIVE

## 2023-10-17 LAB — RPR: RPR Ser Ql: NONREACTIVE

## 2023-10-17 LAB — HIV ANTIBODY (ROUTINE TESTING W REFLEX): HIV Screen 4th Generation wRfx: NONREACTIVE

## 2023-11-06 ENCOUNTER — Encounter: Payer: Self-pay | Admitting: Obstetrics and Gynecology

## 2023-11-06 ENCOUNTER — Ambulatory Visit: Admitting: Obstetrics and Gynecology

## 2023-11-06 ENCOUNTER — Other Ambulatory Visit (HOSPITAL_COMMUNITY)
Admission: RE | Admit: 2023-11-06 | Discharge: 2023-11-06 | Disposition: A | Source: Ambulatory Visit | Attending: Obstetrics and Gynecology | Admitting: Obstetrics and Gynecology

## 2023-11-06 VITALS — BP 133/74 | HR 65 | Ht 59.0 in | Wt 162.0 lb

## 2023-11-06 DIAGNOSIS — Z01419 Encounter for gynecological examination (general) (routine) without abnormal findings: Secondary | ICD-10-CM | POA: Insufficient documentation

## 2023-11-06 DIAGNOSIS — N760 Acute vaginitis: Secondary | ICD-10-CM | POA: Diagnosis not present

## 2023-11-06 DIAGNOSIS — E210 Primary hyperparathyroidism: Secondary | ICD-10-CM | POA: Diagnosis not present

## 2023-11-06 DIAGNOSIS — Z30011 Encounter for initial prescription of contraceptive pills: Secondary | ICD-10-CM

## 2023-11-06 DIAGNOSIS — B9689 Other specified bacterial agents as the cause of diseases classified elsewhere: Secondary | ICD-10-CM | POA: Diagnosis not present

## 2023-11-06 MED ORDER — NORETHIN ACE-ETH ESTRAD-FE 1-20 MG-MCG(24) PO TABS: 1.0000 | ORAL_TABLET | Freq: Every day | ORAL | 11 refills | Status: AC

## 2023-11-06 NOTE — Progress Notes (Signed)
 ANNUAL EXAM Patient name: Mary Monroe MRN [id number]  Date of birth: 10-18-1990 Chief Complaint:   Gynecologic Exam  History of Present Illness:   Mary Monroe is a 33 y.o. G2P0111 female being seen today for a routine annual exam.  Current complaints: frequent BV  Patient's last menstrual period was 10/26/2023 (approximate).   Upstream - 11/06/23 1738       Pregnancy Intention Screening   Does the patient want to become pregnant in the next year? Ok Either Way    Does the patient's partner want to become pregnant in the next year? Ok Either Way    Would the patient like to discuss contraceptive options today? Yes      Contraception Wrap Up   Current Method Vaginal Ring    End Method Oral Contraceptive    How was the end contraceptive method provided? Prescription         The pregnancy intention screening data noted above was reviewed. Potential methods of contraception were discussed. The patient elected to proceed with Oral Contraceptive.   Gynecologic History Patient's last menstrual period was 10/26/23. Contraception: Nuvaring, switching to OCPs Last Pap: approx 3 years ago, results were normal per patient. Single abnormal Pap in 2019 during pregnancy, colposcopy result was normal.  Last mammogram: NA.     11/06/2023    4:26 PM  Depression screen PHQ 2/9  Decreased Interest 0  Down, Depressed, Hopeless 0  PHQ - 2 Score 0  Altered sleeping 0  Tired, decreased energy 1  Change in appetite 0  Feeling bad or failure about yourself  0  Trouble concentrating 0  Moving slowly or fidgety/restless 0  Suicidal thoughts 0  PHQ-9 Score 1      11/06/2023    4:26 PM  GAD 7 : Generalized Anxiety Score  Nervous, Anxious, on Edge 0  Control/stop worrying 0  Worry too much - different things 0  Trouble relaxing 0  Restless 0  Easily annoyed or irritable 0  Afraid - awful might happen 0  Total GAD 7 Score 0   Review of Systems:   Pertinent items are noted in  HPI Denies any headaches, blurred vision, fatigue, shortness of breath, chest pain, abdominal pain, abnormal vaginal discharge/itching/odor/irritation, problems with periods, bowel movements, urination, or intercourse unless otherwise stated above. Pertinent History Reviewed:  Reviewed past medical,surgical, social and family history.  Reviewed problem list, medications and allergies. Physical Assessment:   Vitals:   11/06/23 1536  BP: 133/74  Pulse: 65  Weight: 73.5 kg  Height: 4' 11 (1.499 m)  Body mass index is 32.72 kg/m.        Physical Examination:   General appearance - well appearing, and in no distress  Mental status - alert, oriented   Psych:  She has a normal mood and affect  Skin - warm and dry, normal color  Chest - effort normal, all lung fields clear to auscultation bilaterally  Heart - normal rate and regular rhythm  Neck:  midline trachea, thyroid feels enlarged on exam  Breasts - breasts appear normal, no suspicious masses, no skin or nipple changes or axillary  nodes  Abdomen - soft, non-distended  Pelvic - VULVA: normal appearing vulva with no masses, tenderness or lesions VAGINA: normal  appearing vagina with normal color and discharge, no lesions CERVIX: normal appearing cervix without discharge or lesions  Thin prep pap is done with HR HPV cotesting  Extremities:  No swelling or varicosities noted  Chaperone present for  exam: Nidia Daring, FNP  No results found for this or any previous visit (from the past 24 hours).  Assessment & Plan:   1. Well woman exam with routine gynecological exam (Primary) - Concerned about size of labia, no pain or discomfort with intercourse, feels discomfort throughout daily life; reassured patient labia appear normal on exam today, but offered follow-up with OB/GYN for further discussion - Cytology - PAP( [facility])  2. Encounter for oral contraception initial prescription - Would like to switch from Nuvaring,  discussed contraindications and expected side effects - Norethindrone Acetate-Ethinyl Estrad-FE (LOESTRIN 24 FE) 1-20 MG-MCG(24) tablet; Take 1 tablet by mouth daily.  Dispense: 28 tablet; Refill: 11  3. Bacterial vaginosis - Reports recurrent BV (5 occurrences in past 12 months based on Care Everywhere results), currently taking 7 day course of Flagyl, symptoms present today - Discussed nonpharmacologic prevention and treatment, patient will send MyChart message if she desires pharmacologic treatment for recurrent BV - Will test for ureaplasma and mycoplasma and treat if positive - Genital Mycoplasmas NAA, Swab  4. Primary hyperparathyroidism - Patient aware of diagnosis for a few years and has been recommended surgery - Has been evaluated by PCP and referred to endocrinology; initial visit scheduled 11/23/23   Labs/procedures today: Pap  Mammogram: @ 34yo, or sooner if problems Colonoscopy: @ 34yo, or sooner if problems  Orders Placed This Encounter  Procedures   Genital Mycoplasmas NAA, Swab   Meds:  Meds ordered this encounter  Medications   Norethindrone Acetate-Ethinyl Estrad-FE (LOESTRIN 24 FE) 1-20 MG-MCG(24) tablet    Sig: Take 1 tablet by mouth daily.    Dispense:  28 tablet    Refill:  11   Follow-up: Return gyn consult with [facility] or [facility].  Vernell FORBES Ruddle, Student-MidWife 11/06/2023 5:38 PM

## 2023-11-06 NOTE — Patient Instructions (Signed)
 At Home Vaginal BV Prevention: If engaging in intercourse without a barrier method, consider using semen sponges afterward. You can buy them at [employer].com Begin taking a daily probiotic  Use a gentle antibacterial soap on your vulva (never in the vagina), such as Alaffia's Liquid Black Soap with Tea Tree. Avoid scented or harsh soaps. Use a "tea tree tampon" once a month: Melt 1/4c virgin coconut oil, add 1-2 drops of quality tea tree oil.  Soak a tampon in the mixture  Insert overnight the day after your period has ended. 5.   Place a 30mg  boric acid tablet vaginally every night for 21 nights, then weekly for        9 weeks.

## 2023-11-06 NOTE — Progress Notes (Signed)
 NGYN presents for AEX Last PAP 3-5 years ago  Requesting PAP today   Considering BC pills. Previously using Nuvaring.   New GYN visit scheduled at [facility] 12-05-23

## 2023-11-08 LAB — CYTOLOGY - PAP
Comment: NEGATIVE
Diagnosis: NEGATIVE
Diagnosis: REACTIVE
High risk HPV: NEGATIVE

## 2023-11-09 LAB — GENITAL MYCOPLASMAS NAA, SWAB
Mycoplasma genitalium NAA: NEGATIVE
Mycoplasma hominis NAA: POSITIVE — AB
Ureaplasma spp NAA: POSITIVE — AB

## 2023-11-10 ENCOUNTER — Ambulatory Visit: Payer: Self-pay | Admitting: Obstetrics and Gynecology

## 2023-11-10 MED ORDER — DOXYCYCLINE HYCLATE 100 MG PO CAPS: 100.0000 mg | ORAL_CAPSULE | Freq: Two times a day (BID) | ORAL | 0 refills | Status: AC

## 2023-11-16 ENCOUNTER — Other Ambulatory Visit: Payer: Self-pay

## 2023-11-16 MED ORDER — FLUCONAZOLE 150 MG PO TABS: 150.0000 mg | ORAL_TABLET | Freq: Once | ORAL | 0 refills | Status: AC

## 2023-12-05 ENCOUNTER — Encounter (HOSPITAL_BASED_OUTPATIENT_CLINIC_OR_DEPARTMENT_OTHER): Admitting: Certified Nurse Midwife

## 2023-12-11 ENCOUNTER — Ambulatory Visit: Admitting: Obstetrics and Gynecology

## 2023-12-28 ENCOUNTER — Ambulatory Visit: Admission: EM | Admit: 2023-12-28 | Discharge: 2023-12-28 | Disposition: A | Discharge status: Home / Self Care

## 2023-12-28 DIAGNOSIS — R0982 Postnasal drip: Secondary | ICD-10-CM

## 2023-12-28 DIAGNOSIS — J069 Acute upper respiratory infection, unspecified: Secondary | ICD-10-CM

## 2023-12-28 DIAGNOSIS — M940 Chondrocostal junction syndrome [Tietze]: Secondary | ICD-10-CM

## 2023-12-28 NOTE — ED Triage Notes (Addendum)
 Pt reports the bones in my chest are sore to touch. Sx started today. She has had this before. States they feel swollen or inflamed. Also reports swallowing food feels uncomfortable. She has hypoparathyroidism

## 2023-12-28 NOTE — ED Provider Notes (Signed)
 [facility]    CSN: [id number] Arrival date & time: 12/28/23  1921      History   Chief Complaint Chief Complaint  Patient presents with   Chest Pain    HPI Mary Monroe is a 33 y.o. female.   Pt presents today due to sudden onset of chest pain and throat pain that started today. Pt's child is experiencing a cold right now. Pt denies fever or chills. Pt denies use of medicine for symptoms. Pt is concerned that her symptoms are a complication of recently diagnosed hypoparathyroidism. Pt states that she has an appetite and is not experiencing body aches.   The history is provided by the patient.  Chest Pain   Past Medical History:  Diagnosis Date   Hypoparathyroidism    Urinary tract infection     Patient Active Problem List   Diagnosis Date Noted   Primary hyperparathyroidism 08/12/2021   Vitamin D deficiency 05/31/2018    No past surgical history on file.  OB History     Gravida  2   Para  1   Term      Preterm  1   AB  1   Living  1      SAB      IAB  1   Ectopic      Multiple      Live Births  1            Home Medications    Prior to Admission medications  Medication Sig Start Date End Date Taking? Authorizing Provider  Vitamin D, Ergocalciferol, (DRISDOL) 1.25 MG (50000 UNIT) CAPS capsule Take 50,000 Units by mouth 2 (two) times a week.   Yes [provider]  cetirizine (ZYRTEC) 10 MG tablet Take 10 mg by mouth. 06/03/22   [provider]  fluconazole  (DIFLUCAN ) 150 MG tablet Take 150 mg by mouth once. Patient not taking: Reported on 12/28/2023 11/16/23   [provider]  metroNIDAZOLE  (FLAGYL ) 500 MG tablet Take 1 tablet (500 mg total) by mouth 2 (two) times daily. Patient not taking: Reported on 12/28/2023 10/14/23   Iola Lukes, FNP  Norethindrone Acetate-Ethinyl Estrad-FE (LOESTRIN 24 FE) 1-20 MG-MCG(24) tablet Take 1 tablet by mouth daily. Patient not taking: Reported on 12/28/2023  11/06/23   Delores Nidia CROME, FNP    Family History No family history on file.  Social History Social History[1]   Allergies   Patient has no known allergies.   Review of Systems Review of Systems  Cardiovascular:  Positive for chest pain.     Physical Exam Triage Vital Signs ED Triage Vitals  Encounter Vitals Group     BP 12/28/23 1929 109/75     Girls Systolic BP Percentile --      Girls Diastolic BP Percentile --      Boys Systolic BP Percentile --      Boys Diastolic BP Percentile --      Pulse Rate 12/28/23 1929 81     Resp 12/28/23 1929 18     Temp 12/28/23 1929 97.8 F (36.6 C)     Temp Source 12/28/23 1929 Oral     SpO2 12/28/23 1929 96 %     Weight --      Height --      Head Circumference --      Peak Flow --      Pain Score 12/28/23 1945 8     Pain Loc --      Pain  Education --      Exclude from [employer] Chart --    No data found.  Updated Vital Signs BP 109/75 (BP Location: Left Arm)   Pulse 81   Temp 97.8 F (36.6 C) (Oral)   Resp 18   LMP 12/25/2023   SpO2 96%   Visual Acuity Right Eye Distance:   Left Eye Distance:   Bilateral Distance:    Right Eye Near:   Left Eye Near:    Bilateral Near:     Physical Exam Vitals and nursing note reviewed.  Constitutional:      General: She is not in acute distress.    Appearance: Normal appearance. She is not ill-appearing, toxic-appearing or diaphoretic.  HENT:     Nose: Congestion (moderately enlarged turbinates) present. No rhinorrhea.     Mouth/Throat:     Mouth: Mucous membranes are moist.     Pharynx: Oropharynx is clear. No oropharyngeal exudate or posterior oropharyngeal erythema.  Eyes:     General: No scleral icterus. Cardiovascular:     Rate and Rhythm: Normal rate and regular rhythm.     Heart sounds: Normal heart sounds.  Pulmonary:     Effort: Pulmonary effort is normal. No respiratory distress.     Breath sounds: Normal breath sounds. No wheezing or rhonchi.  Chest:      Chest wall: Tenderness present.       Comments: Tenderness to palpation where indicated on diagram  Skin:    General: Skin is warm.  Neurological:     Mental Status: She is alert and oriented to person, place, and time.  Psychiatric:        Mood and Affect: Mood normal.        Behavior: Behavior normal.      UC Treatments / Results  Labs (all labs ordered are listed, but only abnormal results are displayed) Labs Reviewed - No data to display  EKG   Radiology No results found.  Procedures Procedures (including critical care time)  Medications Ordered in UC Medications - No data to display  Initial Impression / Assessment and Plan / UC Course  I have reviewed the triage vital signs and the nursing notes.  Pertinent labs & imaging results that were available during my care of the patient were reviewed by me and considered in my medical decision making (see chart for details).      Final Clinical Impressions(s) / UC Diagnoses   Final diagnoses:  Costochondritis  Postnasal drip  Viral URI     Discharge Instructions      You been diagnosed with a viral illness today. -Viruses have to run their course and medicines that are prescribed are meant to help with symptoms. - With viruses usually feel poorly from 3 to 7 days with cough being the last symptoms to resolve.  -Cough can linger from days to weeks.  Antibiotics are not effective for viruses. -If your cough lasts more than 2 weeks and you are coughing so hard that you are vomiting or feel like you could pass out we need to follow-up with PCP for further testing and evaluation. -Rest, increase water intake, may use pseudoephedrine for nasal congestion, Delsym (dextromethorphan) or honey as needed for cough, and ibuprofen  and/or Tylenol  as directed on packaging for pain and fever. -If you have hypertension you should take Coricidin or other OTC meds approved for people with high blood pressure. -You may use a  spoonful of honey every 4-6 hours as needed for throat  pain and cough. -Warm tea with honey and lemon are helpful for soothe throat as well.  Chloraseptic and Cepacol make a throat lozenge with numbing medication, can be purchased over-the-counter. -May also use Flonase or sinus rinse for sinus pressure or nasal congestion.  Be sure to use distilled bottled water for sinus rinses. -May use coolmist humidifier to open up nasal passages -May elevate head to assist with postnasal drainage. -If you feel poorly (fever, fatigue, shortness of breath, nausea, etc.) for more than 10 days to be sure to follow-up with PCP or in clinic for further evaluation and additional treatments. If you experience chest pain with shortness of breath or pulse oxygen less than 95% you should report to the ER.     ED Prescriptions   None    PDMP not reviewed this encounter.    [1]  Social History Tobacco Use   Smoking status: Never    Passive exposure: Never  Vaping Use   Vaping status: Never Used  Substance Use Topics   Alcohol use: Yes    Comment: socially   Drug use: Never     Andra Corean BROCKS, PA-C 12/28/23 2005

## 2023-12-28 NOTE — Discharge Instructions (Addendum)

## 2024-02-02 ENCOUNTER — Ambulatory Visit: Admitting: Obstetrics and Gynecology

## 2024-02-23 ENCOUNTER — Ambulatory Visit: Admitting: Obstetrics and Gynecology
